# Patient Record
Sex: Male | Born: 1980 | Race: Black or African American | Hispanic: No | Marital: Married | Smoking: Current some day smoker
Health system: Southern US, Community
[De-identification: ages and names within clinical notes are randomized; demographics above are authoritative.]

## PROBLEM LIST (undated history)

## (undated) ENCOUNTER — Ambulatory Visit (HOSPITAL_COMMUNITY): Payer: Self-pay | Source: Home / Self Care

## (undated) DIAGNOSIS — M549 Dorsalgia, unspecified: Secondary | ICD-10-CM

## (undated) HISTORY — PX: APPENDECTOMY: SHX54

---

## 2002-01-05 ENCOUNTER — Emergency Department (HOSPITAL_COMMUNITY): Admission: EM | Admit: 2002-01-05 | Discharge: 2002-01-05 | Payer: Self-pay | Admitting: Emergency Medicine

## 2002-06-03 ENCOUNTER — Emergency Department (HOSPITAL_COMMUNITY): Admission: EM | Admit: 2002-06-03 | Discharge: 2002-06-03 | Payer: Self-pay | Admitting: *Deleted

## 2002-10-27 ENCOUNTER — Emergency Department (HOSPITAL_COMMUNITY): Admission: EM | Admit: 2002-10-27 | Discharge: 2002-10-27 | Payer: Self-pay | Admitting: *Deleted

## 2003-02-11 ENCOUNTER — Emergency Department (HOSPITAL_COMMUNITY): Admission: EM | Admit: 2003-02-11 | Discharge: 2003-02-11 | Payer: Self-pay | Admitting: Emergency Medicine

## 2003-02-11 ENCOUNTER — Encounter: Payer: Self-pay | Admitting: Emergency Medicine

## 2003-08-02 ENCOUNTER — Emergency Department (HOSPITAL_COMMUNITY): Admission: EM | Admit: 2003-08-02 | Discharge: 2003-08-02 | Payer: Self-pay | Admitting: Emergency Medicine

## 2004-02-09 ENCOUNTER — Emergency Department (HOSPITAL_COMMUNITY): Admission: EM | Admit: 2004-02-09 | Discharge: 2004-02-09 | Payer: Self-pay | Admitting: Emergency Medicine

## 2004-09-13 ENCOUNTER — Emergency Department (HOSPITAL_COMMUNITY): Admission: EM | Admit: 2004-09-13 | Discharge: 2004-09-13 | Payer: Self-pay | Admitting: Emergency Medicine

## 2004-11-13 ENCOUNTER — Emergency Department (HOSPITAL_COMMUNITY): Admission: EM | Admit: 2004-11-13 | Discharge: 2004-11-13 | Payer: Self-pay | Admitting: Emergency Medicine

## 2006-06-15 ENCOUNTER — Emergency Department (HOSPITAL_COMMUNITY): Admission: EM | Admit: 2006-06-15 | Discharge: 2006-06-15 | Payer: Self-pay | Admitting: Emergency Medicine

## 2008-02-07 ENCOUNTER — Emergency Department (HOSPITAL_COMMUNITY): Admission: EM | Admit: 2008-02-07 | Discharge: 2008-02-07 | Payer: Self-pay | Admitting: Emergency Medicine

## 2008-02-08 ENCOUNTER — Emergency Department (HOSPITAL_COMMUNITY): Admission: EM | Admit: 2008-02-08 | Discharge: 2008-02-09 | Payer: Self-pay | Admitting: Emergency Medicine

## 2011-05-14 ENCOUNTER — Emergency Department (HOSPITAL_COMMUNITY)
Admission: EM | Admit: 2011-05-14 | Discharge: 2011-05-14 | Disposition: A | Discharge status: Home / Self Care | Payer: Self-pay | Attending: Emergency Medicine | Admitting: Emergency Medicine

## 2011-05-14 ENCOUNTER — Emergency Department (HOSPITAL_COMMUNITY): Payer: Self-pay

## 2011-05-14 DIAGNOSIS — N200 Calculus of kidney: Secondary | ICD-10-CM | POA: Insufficient documentation

## 2011-05-14 DIAGNOSIS — M25569 Pain in unspecified knee: Secondary | ICD-10-CM | POA: Insufficient documentation

## 2011-05-14 DIAGNOSIS — R404 Transient alteration of awareness: Secondary | ICD-10-CM | POA: Insufficient documentation

## 2011-05-14 DIAGNOSIS — F1010 Alcohol abuse, uncomplicated: Secondary | ICD-10-CM | POA: Insufficient documentation

## 2011-05-14 DIAGNOSIS — R079 Chest pain, unspecified: Secondary | ICD-10-CM | POA: Insufficient documentation

## 2011-05-14 DIAGNOSIS — S0990XA Unspecified injury of head, initial encounter: Secondary | ICD-10-CM | POA: Insufficient documentation

## 2011-05-14 DIAGNOSIS — Y9289 Other specified places as the place of occurrence of the external cause: Secondary | ICD-10-CM | POA: Insufficient documentation

## 2011-05-14 DIAGNOSIS — F1410 Cocaine abuse, uncomplicated: Secondary | ICD-10-CM | POA: Insufficient documentation

## 2011-05-14 DIAGNOSIS — M79609 Pain in unspecified limb: Secondary | ICD-10-CM | POA: Insufficient documentation

## 2011-05-14 DIAGNOSIS — IMO0002 Reserved for concepts with insufficient information to code with codable children: Secondary | ICD-10-CM | POA: Insufficient documentation

## 2011-05-14 LAB — CBC
HCT: 43.3 % (ref 39.0–52.0)
MCH: 32.6 pg (ref 26.0–34.0)
MCV: 90.6 fL (ref 78.0–100.0)
Platelets: 239 10*3/uL (ref 150–400)
RBC: 4.78 MIL/uL (ref 4.22–5.81)
WBC: 4.8 10*3/uL (ref 4.0–10.5)

## 2011-05-14 LAB — POCT I-STAT, CHEM 8
BUN: 9 mg/dL (ref 6–23)
Calcium, Ion: 1.08 mmol/L — ABNORMAL LOW (ref 1.12–1.32)
Chloride: 104 mEq/L (ref 96–112)
Glucose, Bld: 98 mg/dL (ref 70–99)
Potassium: 3.6 mEq/L (ref 3.5–5.1)

## 2011-05-14 LAB — DIFFERENTIAL
Eosinophils Absolute: 0 10*3/uL (ref 0.0–0.7)
Lymphocytes Relative: 32 % (ref 12–46)
Lymphs Abs: 1.5 10*3/uL (ref 0.7–4.0)
Monocytes Relative: 6 % (ref 3–12)
Neutrophils Relative %: 61 % (ref 43–77)

## 2011-05-14 LAB — RAPID URINE DRUG SCREEN, HOSP PERFORMED
Amphetamines: NOT DETECTED
Benzodiazepines: NOT DETECTED
Cocaine: POSITIVE — AB
Opiates: NOT DETECTED
Tetrahydrocannabinol: NOT DETECTED

## 2011-05-14 LAB — SALICYLATE LEVEL: Salicylate Lvl: <2 mg/dL — ABNORMAL LOW (ref 2.8–20.0)

## 2011-05-14 MED ORDER — IOHEXOL 300 MG/ML  SOLN
100.0000 mL | Freq: Once | INTRAMUSCULAR | Status: AC | PRN
  Administered 2011-05-14: 100 mL via INTRAVENOUS

## 2011-07-31 ENCOUNTER — Emergency Department (HOSPITAL_COMMUNITY)
Admission: EM | Admit: 2011-07-31 | Discharge: 2011-07-31 | Disposition: A | Discharge status: Home / Self Care | Payer: Self-pay | Attending: Emergency Medicine | Admitting: Emergency Medicine

## 2011-07-31 DIAGNOSIS — IMO0002 Reserved for concepts with insufficient information to code with codable children: Secondary | ICD-10-CM | POA: Insufficient documentation

## 2012-01-15 ENCOUNTER — Ambulatory Visit: Payer: BC Managed Care – PPO

## 2012-01-15 ENCOUNTER — Ambulatory Visit (INDEPENDENT_AMBULATORY_CARE_PROVIDER_SITE_OTHER): Payer: BC Managed Care – PPO | Admitting: Emergency Medicine

## 2012-01-15 VITALS — BP 120/96 | HR 87 | Temp 98.3°F | Resp 16 | Ht 71.5 in | Wt 200.6 lb

## 2012-01-15 DIAGNOSIS — IMO0002 Reserved for concepts with insufficient information to code with codable children: Secondary | ICD-10-CM

## 2012-01-15 DIAGNOSIS — S46919A Strain of unspecified muscle, fascia and tendon at shoulder and upper arm level, unspecified arm, initial encounter: Secondary | ICD-10-CM

## 2012-01-15 DIAGNOSIS — M778 Other enthesopathies, not elsewhere classified: Secondary | ICD-10-CM

## 2012-01-15 DIAGNOSIS — M25539 Pain in unspecified wrist: Secondary | ICD-10-CM

## 2012-01-15 MED ORDER — HYDROCODONE-ACETAMINOPHEN 5-325 MG PO TABS: 1.0000 | ORAL_TABLET | Freq: Four times a day (QID) | ORAL | Status: AC | PRN

## 2012-01-15 MED ORDER — MELOXICAM 15 MG PO TABS: 15.0000 mg | ORAL_TABLET | Freq: Every day | ORAL | Status: AC

## 2012-01-15 NOTE — Progress Notes (Signed)
  Subjective:    Patient ID: Nathan Simon, male    DOB: Aug 02, 1981, 31 y.o.   MRN: [id number]  HPI patient here with pain in his right arm. Patient works with his right arm continuously at work. He has severe pain in his elbow forearm and right wrist. He feels weak in his right arm.    Review of Systems review of systems is consistent with the discomfort he is feeling in his right arm     Objective:   Physical Exam  Musculoskeletal:       There is tenderness over the right biceps muscle. Pulses are 2+ and symmetrical right radial and ulnar arteries. Deep tendon reflexes are 2+ and symmetrical. Motor is 5 out of 5 in left arm on the right arm the patient has decreased grip strength and weakness with extension of the arm against resistance    [facility] reading (PRIMARY) by  Dr.Brithany Whitworth x-ray of the forearm and of the elbow are normal.       Assessment & Plan:  Patient has an overuse syndrome of the right arm placed on Mobic 15 mg one every morning Norco 320 5 at night for pain.

## 2019-02-27 ENCOUNTER — Ambulatory Visit (HOSPITAL_COMMUNITY)
Admission: EM | Admit: 2019-02-27 | Discharge: 2019-02-27 | Disposition: A | Discharge status: Home / Self Care | Payer: 59 | Attending: Orthopedic Surgery | Admitting: Orthopedic Surgery

## 2019-02-27 ENCOUNTER — Encounter (HOSPITAL_COMMUNITY): Payer: Self-pay

## 2019-02-27 ENCOUNTER — Other Ambulatory Visit: Payer: Self-pay

## 2019-02-27 DIAGNOSIS — M545 Low back pain, unspecified: Secondary | ICD-10-CM

## 2019-02-27 DIAGNOSIS — S39012A Strain of muscle, fascia and tendon of lower back, initial encounter: Secondary | ICD-10-CM

## 2019-02-27 HISTORY — DX: Dorsalgia, unspecified: M54.9

## 2019-02-27 MED ORDER — METHOCARBAMOL 500 MG PO TABS: 500.0000 mg | ORAL_TABLET | Freq: Three times a day (TID) | ORAL | 0 refills | Status: DC

## 2019-02-27 NOTE — Discharge Instructions (Addendum)
Please continue with ibuprofen over-the-counter and take methocarbamol as needed for muscle relaxation.  Avoid heavy lifting pushing or pulling.  Follow-up with orthopedics.  Return to the urgent care facility for any worsening symptoms or urgent changes in your health.

## 2019-02-27 NOTE — ED Triage Notes (Signed)
Pt presents with chronic back pain. 

## 2019-02-27 NOTE — ED Provider Notes (Signed)
[facility]    CSN: [id number] Arrival date & time: 02/27/19  1635     History   Chief Complaint Chief Complaint  Patient presents with  . Back Pain    HPI Nathan Simon is a 38 y.o. male.   Presents to the urgent care facility for evaluation of acute on chronic back pain.  Patient states few days ago he was lifting at work and felt some pain in his lower back.  He has had intermittent episodes of left lower back pain and muscle tightness over the last 2 years.  Patient states his back pain was feeling better while taking muscle relaxer, methocarbamol but patient ran out of this medication today and is requesting a refill.  He is also requesting a note for work on Friday and for tomorrow, Monday.  HPI  Past Medical History:  Diagnosis Date  . Back ache     There are no active problems to display for this patient.   Past Surgical History:  Procedure Laterality Date  . APPENDECTOMY         Home Medications    Prior to Admission medications   Medication Sig Start Date End Date Taking? Authorizing Provider  methocarbamol (ROBAXIN) 500 MG tablet Take 1 tablet (500 mg total) by mouth 3 (three) times daily. 02/27/19   Evon Slack, PA-C    Family History History reviewed. No pertinent family history.  Social History Social History   Tobacco Use  . Smoking status: Current Some Day Smoker    Types: Cigars  Substance Use Topics  . Alcohol use: Not on file  . Drug use: Not on file     Allergies   Patient has no known allergies.   Review of Systems Review of Systems  Constitutional: Negative for fever.  Respiratory: Negative for shortness of breath.   Cardiovascular: Negative for chest pain.  Gastrointestinal: Negative for abdominal pain.  Genitourinary: Negative for difficulty urinating, dysuria and urgency.  Musculoskeletal: Positive for back pain. Negative for gait problem, joint swelling, myalgias and neck pain.  Skin: Negative for rash.   Neurological: Negative for dizziness, numbness and headaches.     Physical Exam Triage Vital Signs ED Triage Vitals  Enc Vitals Group     BP 02/27/19 1655 (!) 146/89     Pulse Rate 02/27/19 1655 75     Resp 02/27/19 1655 18     Temp 02/27/19 1655 98.5 F (36.9 C)     Temp Source 02/27/19 1655 Oral     SpO2 02/27/19 1655 96 %     Weight --      Height --      Head Circumference --      Peak Flow --      Pain Score 02/27/19 1656 7     Pain Loc --      Pain Edu? --      Excl. in GC? --    No data found.  Updated Vital Signs BP (!) 146/89 (BP Location: Right Arm)   Pulse 75   Temp 98.5 F (36.9 C) (Oral)   Resp 18   SpO2 96%   Visual Acuity Right Eye Distance:   Left Eye Distance:   Bilateral Distance:    Right Eye Near:   Left Eye Near:    Bilateral Near:     Physical Exam Constitutional:      Appearance: He is well-developed.  HENT:     Head: Normocephalic and atraumatic.  Eyes:  Conjunctiva/sclera: Conjunctivae normal.  Neck:     Musculoskeletal: Normal range of motion.  Cardiovascular:     Rate and Rhythm: Normal rate.  Pulmonary:     Effort: Pulmonary effort is normal. No respiratory distress.  Musculoskeletal:     Comments: Lumbar Spine: Examination of the lumbar spine reveals no bony abnormality, no edema, and no ecchymosis.  There is no step off.  The patient has full range of motion of the lumbar spine with flexion and extension.  The patient has normal lateral bend and rotation.  There is pain with lumbar flexion and extension.  The patient has a negative axial load test, and a negative rotational Waddell test.  The patient is non tender along the spinous process.  The patient is tender along the paravertebral muscles, along the left SI joint.  The patient is non tender along the iliac crest.  The patient is non tender in the sciatic notch.  The patient is non tender along the Sacroiliac joint.  There is no Coccyx joint tenderness.    Bilateral  Lower Extremities: Examination of the lower extremities reveals no bony abnormality, no edema, and no ecchymosis.  The patient has full active and passive range of motion of the hips, knees, and ankles.  There is no discomfort with range of motion exercises.  The patient is non tender along the greater trochanter region.  The patient has a negative Denna Haggard' test bilaterally.  There is normal skin warmth.  There is normal capillary refill bilaterally.    Neurologic: The patient has a negative straight leg raise.  The patient has normal muscle strength testing for the quadriceps, calves, ankle dorsiflexion, ankle plantarflexion, and extensor hallicus longus.  The patient has sensation that is intact to light touch.  Patellar reflexes are normal bilaterally.   Skin:    General: Skin is warm.     Findings: No rash.  Neurological:     Mental Status: He is alert and oriented to person, place, and time.  Psychiatric:        Behavior: Behavior normal.        Thought Content: Thought content normal.      UC Treatments / Results  Labs (all labs ordered are listed, but only abnormal results are displayed) Labs Reviewed - No data to display  EKG None  Radiology No results found.  Procedures Procedures (including critical care time)  Medications Ordered in UC Medications - No data to display  Initial Impression / Assessment and Plan / UC Course  I have reviewed the triage vital signs and the nursing notes.  Pertinent labs & imaging results that were available during my care of the patient were reviewed by me and considered in my medical decision making (see chart for details).     38 year old male with acute on chronic low back pain with muscle spasms.  No radicular symptoms or neurological deficits.  Vital signs are stable.  He is given a prescription for methocarbamol and a work note for days missed and also for tomorrow.  Patient will follow-up with primary care provider.  He will  continue with over-the-counter ibuprofen.  Avoid heavy lifting. Final Clinical Impressions(s) / UC Diagnoses   Final diagnoses:  Acute left-sided low back pain without sciatica  Strain of lumbar region, initial encounter     Discharge Instructions     Please continue with ibuprofen over-the-counter and take methocarbamol as needed for muscle relaxation.  Avoid heavy lifting pushing or pulling.  Follow-up with orthopedics.  Return to the urgent care facility for any worsening symptoms or urgent changes in your health.   ED Prescriptions    Medication Sig Dispense Auth. Provider   methocarbamol (ROBAXIN) 500 MG tablet Take 1 tablet (500 mg total) by mouth 3 (three) times daily. 30 tablet Ronnette JuniperGaines,  C, PA-C       Evon SlackGaines,  C, [place]PA-C 02/27/19 1724

## 2019-04-06 ENCOUNTER — Ambulatory Visit (HOSPITAL_COMMUNITY)
Admission: EM | Admit: 2019-04-06 | Discharge: 2019-04-06 | Disposition: A | Discharge status: Home / Self Care | Payer: 59

## 2019-04-06 ENCOUNTER — Other Ambulatory Visit: Payer: Self-pay

## 2019-04-06 ENCOUNTER — Encounter (HOSPITAL_COMMUNITY): Payer: Self-pay

## 2019-04-06 ENCOUNTER — Ambulatory Visit (HOSPITAL_COMMUNITY): Admission: EM | Admit: 2019-04-06 | Discharge: 2019-04-06 | Discharge status: Absent without leave | Payer: Self-pay

## 2019-04-06 DIAGNOSIS — R609 Edema, unspecified: Secondary | ICD-10-CM | POA: Diagnosis not present

## 2019-04-06 DIAGNOSIS — R600 Localized edema: Secondary | ICD-10-CM

## 2019-04-06 NOTE — Discharge Instructions (Addendum)
Nothing concerning on exam today. Decrease sodium intake and rest and elevate the legs. Compression stockings or socks can help.  If your symptoms continue or worsen and you start developing other concerning symptoms to include headache, dizziness, chest pain or shortness of breath please be seen in person again for reevaluation

## 2019-04-06 NOTE — ED Provider Notes (Signed)
[facility]    CSN: [id number] Arrival date & time: 04/06/19  1316     History   Chief Complaint Chief Complaint  Patient presents with  . Leg Swelling    HPI Nathan Simon is a 38 y.o. male.   Patient is a 38 year old male with no significant past medical history.  He is presenting today with bilateral lower extremity edema and tenderness.  Symptoms have improved.  This started approximately 5 days ago.  Started after doing a lot of walking in the heat.  He also does a lot of walking and standing at work.  Symptoms improved with elevating the legs.  He has had increased sodium and water intake over the last for 5 days.  There is no current pain or swelling.  Denies any associated chest pain, shortness of breath, headaches or dizziness.  Denies any history of high blood pressure.  He does have family history of high blood pressure.  Patient is normotensive here today.   ROS per HPI      Past Medical History:  Diagnosis Date  . Back ache     There are no active problems to display for this patient.   Past Surgical History:  Procedure Laterality Date  . APPENDECTOMY         Home Medications    Prior to Admission medications   Medication Sig Start Date End Date Taking? Authorizing Provider  methocarbamol (ROBAXIN) 500 MG tablet Take 1 tablet (500 mg total) by mouth 3 (three) times daily. 02/27/19   Evon SlackGaines, Thomas C, PA-C    Family History History reviewed. No pertinent family history.  Social History Social History   Tobacco Use  . Smoking status: Current Some Day Smoker    Types: Cigars  . Smokeless tobacco: Never Used  Substance Use Topics  . Alcohol use: Not on file  . Drug use: Not on file     Allergies   Patient has no known allergies.   Review of Systems Review of Systems   Physical Exam Triage Vital Signs ED Triage Vitals  Enc Vitals Group     BP 04/06/19 1348 110/78     Pulse Rate 04/06/19 1348 82     Resp 04/06/19 1348  18     Temp 04/06/19 1348 98.4 F (36.9 C)     Temp src --      SpO2 04/06/19 1348 99 %     Weight 04/06/19 1346 205 lb (93 kg)     Height --      Head Circumference --      Peak Flow --      Pain Score 04/06/19 1346 4     Pain Loc --      Pain Edu? --      Excl. in GC? --    No data found.  Updated Vital Signs BP 110/78 (BP Location: Right Arm)   Pulse 82   Temp 98.4 F (36.9 C)   Resp 18   Wt 205 lb (93 kg)   SpO2 99%   BMI 28.19 kg/m   Visual Acuity Right Eye Distance:   Left Eye Distance:   Bilateral Distance:    Right Eye Near:   Left Eye Near:    Bilateral Near:     Physical Exam Vitals signs and nursing note reviewed.  Constitutional:      General: He is not in acute distress.    Appearance: Normal appearance. He is not ill-appearing, toxic-appearing or diaphoretic.  HENT:     Head: Normocephalic and atraumatic.     Nose: Nose normal.  Eyes:     Conjunctiva/sclera: Conjunctivae normal.  Neck:     Musculoskeletal: Normal range of motion.  Cardiovascular:     Rate and Rhythm: Normal rate and regular rhythm.     Pulses: Normal pulses.     Heart sounds: Normal heart sounds.  Pulmonary:     Effort: Pulmonary effort is normal.     Breath sounds: Normal breath sounds.  Musculoskeletal: Normal range of motion.  Skin:    General: Skin is warm and dry.  Neurological:     General: No focal deficit present.     Mental Status: He is alert.  Psychiatric:        Mood and Affect: Mood normal.      UC Treatments / Results  Labs (all labs ordered are listed, but only abnormal results are displayed) Labs Reviewed - No data to display  EKG None  Radiology No results found.  Procedures Procedures (including critical care time)  Medications Ordered in UC Medications - No data to display  Initial Impression / Assessment and Plan / UC Course  I have reviewed the triage vital signs and the nursing notes.  Pertinent labs & imaging results that were  available during my care of the patient were reviewed by me and considered in my medical decision making (see chart for details).     Exam benign No noticeable lower extremity swelling.  No calf pain or tenderness Patient denying any concerning signs or symptoms Reporting increase in walking, heat and sodium and water intake. Most likely this is the cause of his lower extremity edema His problem has improved with elevating his legs He has normotensive here today We will have him monitor his symptoms, decrease sodium intake and elevate legs.  Recommended compression stockings to help. If symptoms continue or worsen he did starts developing any chest pain, shortness of breath or other concerning symptoms he will need to be re-seen, most likely in the ER. Patient understanding and agree. Final Clinical Impressions(s) / UC Diagnoses   Final diagnoses:  Peripheral edema     Discharge Instructions     Nothing concerning on exam today. Decrease sodium intake and rest and elevate the legs. Compression stockings or socks can help.  If your symptoms continue or worsen and you start developing other concerning symptoms to include headache, dizziness, chest pain or shortness of breath please be seen in person again for reevaluation    ED Prescriptions    None     Controlled Substance Prescriptions [place] Controlled Substance Registry consulted? Not Applicable   Orvan July, NP 04/06/19 1440

## 2019-04-06 NOTE — ED Triage Notes (Signed)
Pt cc states his legs were swollen last night . Pt states the swelling went down and now they sore.  Pt states he was out protesting and doing a lot of walking around.

## 2019-04-27 ENCOUNTER — Ambulatory Visit (INDEPENDENT_AMBULATORY_CARE_PROVIDER_SITE_OTHER): Payer: Self-pay | Admitting: Internal Medicine

## 2019-04-27 ENCOUNTER — Encounter: Payer: Self-pay | Admitting: Internal Medicine

## 2019-04-27 ENCOUNTER — Other Ambulatory Visit: Payer: Self-pay

## 2019-04-27 DIAGNOSIS — G8929 Other chronic pain: Secondary | ICD-10-CM | POA: Insufficient documentation

## 2019-04-27 DIAGNOSIS — M545 Low back pain, unspecified: Secondary | ICD-10-CM | POA: Insufficient documentation

## 2019-04-27 DIAGNOSIS — R600 Localized edema: Secondary | ICD-10-CM | POA: Insufficient documentation

## 2019-04-27 NOTE — Progress Notes (Signed)
    Virtual Visit via Video Note  I connected with Nathan Simon on 04/27/19 at  3:30 PM EDT by a video enabled telemedicine application and verified that I am speaking with the correct person using two identifiers.  Location patient: home Location provider: work office Persons participating in the virtual visit: patient, provider  I discussed the limitations of evaluation and management by telemedicine and the availability of in person appointments. The patient expressed understanding and agreed to proceed.   HPI: He has scheduled this visit to establish care. His PMH is only significant for long-standing back pain which he would like assessed. He has never been imaged or done PT. [place] the [place] recently had to go to UC because of swelling in both legs. He was told his BP was fine and it was probably due to a combination of the heat and increased sodium in diet. He feels his legs have improved some but would like to be seen for that as well.   ROS: Constitutional: Denies fever, chills, diaphoresis, appetite change and fatigue.  HEENT: Denies photophobia, eye pain, redness, hearing loss, ear pain, congestion, sore throat, rhinorrhea, sneezing, mouth sores, trouble swallowing, neck pain, neck stiffness and tinnitus.   Respiratory: Denies SOB, DOE, cough, chest tightness,  and wheezing.   Cardiovascular: Denies chest pain, palpitations..  Gastrointestinal: Denies nausea, vomiting, abdominal pain, diarrhea, constipation, blood in stool and abdominal distention.  Genitourinary: Denies dysuria, urgency, frequency, hematuria, flank pain and difficulty urinating.  Endocrine: Denies: hot or cold intolerance, sweats, changes in hair or nails, polyuria, polydipsia. Musculoskeletal: Denies myalgias, back pain, joint swelling, arthralgias and gait problem.  Skin: Denies pallor, rash and wound.  Neurological: Denies dizziness, seizures, syncope, weakness, light-headedness, numbness  and headaches.  Hematological: Denies adenopathy. Easy bruising, personal or family bleeding history  Psychiatric/Behavioral: Denies suicidal ideation, mood changes, confusion, nervousness, sleep disturbance and agitation   History reviewed. No pertinent past medical history.  History reviewed. No pertinent surgical history.  History reviewed. No pertinent family history.  SOCIAL HX:   has no history on file for tobacco, alcohol, and drug.  No current outpatient medications on file.  EXAM:   VITALS per patient if applicable: none reported  GENERAL: alert, oriented, appears well and in no acute distress  HEENT: atraumatic, conjunttiva clear, no obvious abnormalities on inspection of external nose and ears  NECK: normal movements of the head and neck  LUNGS: on inspection no signs of respiratory distress, breathing rate appears normal, no obvious gross increased work of breathing, gasping or wheezing  CV: no obvious cyanosis  MS: moves all visible extremities without noticeable abnormality  PSYCH/NEURO: pleasant and cooperative, no obvious depression or anxiety, speech and thought processing grossly intact  ASSESSMENT AND PLAN:   Bilateral leg edema  Chronic midline low back pain without sciatica  -He will come in the office for formal evaluation and CPE     I discussed the assessment and treatment plan with the patient. The patient was provided an opportunity to ask questions and all were answered. The patient agreed with the plan and demonstrated an understanding of the instructions.   The patient was advised to call back or seek an in-person evaluation if the symptoms worsen or if the condition fails to improve as anticipated.    Lelon Frohlich, MD   Primary Care at [facility]

## 2019-06-03 ENCOUNTER — Encounter (HOSPITAL_COMMUNITY): Payer: Self-pay

## 2019-06-07 ENCOUNTER — Ambulatory Visit: Payer: Self-pay | Admitting: Internal Medicine

## 2019-06-07 DIAGNOSIS — Z0289 Encounter for other administrative examinations: Secondary | ICD-10-CM

## 2019-08-04 ENCOUNTER — Other Ambulatory Visit: Payer: Self-pay

## 2019-08-04 ENCOUNTER — Ambulatory Visit (HOSPITAL_COMMUNITY)
Admission: EM | Admit: 2019-08-04 | Discharge: 2019-08-04 | Disposition: A | Discharge status: Home / Self Care | Payer: 59 | Attending: Family Medicine | Admitting: Family Medicine

## 2019-08-04 ENCOUNTER — Encounter (HOSPITAL_COMMUNITY): Payer: Self-pay

## 2019-08-04 DIAGNOSIS — K4090 Unilateral inguinal hernia, without obstruction or gangrene, not specified as recurrent: Secondary | ICD-10-CM | POA: Diagnosis not present

## 2019-08-04 MED ORDER — IBUPROFEN 800 MG PO TABS: 800.0000 mg | ORAL_TABLET | Freq: Three times a day (TID) | ORAL | 0 refills | Status: DC

## 2019-08-04 NOTE — ED Provider Notes (Signed)
[facility] [place]    CSN: [id number] Arrival date & time: 08/04/19  1003      History   Chief Complaint Chief Complaint  Patient presents with  . Abdominal Pain    HPI Nathan Simon is a 38 y.o. male.   HPI  Patient is here for lower abdominal pain.  He has a bulge and lower abdominal pain on the left side.  Is down in his groin.  It hurts when he strains.  It hurts when he sits up after lying flat in bed.  He states is been present for 2 weeks.  It is getting worse.  He does a lot of heavy lifting on the job.  He does not remember an incident on the job.  He has not reported to his employer. I explained to him that even though he does repetitive and heavy lifting and thus he has an incident that he can recall, it is unlikely that he will be able to claim this is Worker's Comp. Normal appetite.  Normal bowels.  No fever. Past Medical History:  Diagnosis Date  . Back ache     Patient Active Problem List   Diagnosis Date Noted  . Bilateral leg edema 04/27/2019  . Chronic low back pain without sciatica 04/27/2019    Past Surgical History:  Procedure Laterality Date  . APPENDECTOMY         Home Medications    Prior to Admission medications   Medication Sig Start Date End Date Taking? Authorizing Provider  ibuprofen (ADVIL) 800 MG tablet Take 1 tablet (800 mg total) by mouth 3 (three) times daily. 08/04/19   Raylene Everts, MD  methocarbamol (ROBAXIN) 500 MG tablet Take 1 tablet (500 mg total) by mouth 3 (three) times daily. 02/27/19   Duanne Guess, PA-C    Family History History reviewed. No pertinent family history.  Social History Social History   Tobacco Use  . Smoking status: Current Some Day Smoker    Types: Cigars  . Smokeless tobacco: Never Used  Substance Use Topics  . Alcohol use: Not on file  . Drug use: Not on file     Allergies   Patient has no known allergies.   Review of Systems Review of Systems  Constitutional: Negative  for chills and fever.  HENT: Negative for ear pain and sore throat.   Eyes: Negative for pain and visual disturbance.  Respiratory: Negative for cough and shortness of breath.   Cardiovascular: Negative for chest pain and palpitations.  Gastrointestinal: Positive for abdominal pain. Negative for nausea and vomiting.  Genitourinary: Negative for dysuria, hematuria, scrotal swelling and testicular pain.  Musculoskeletal: Negative for arthralgias and back pain.  Skin: Negative for color change and rash.  Neurological: Negative for seizures and syncope.  All other systems reviewed and are negative.    Physical Exam Triage Vital Signs ED Triage Vitals  Enc Vitals Group     BP 08/04/19 1023 115/79     Pulse Rate 08/04/19 1023 87     Resp 08/04/19 1023 16     Temp 08/04/19 1023 98.9 F (37.2 C)     Temp Source 08/04/19 1023 Oral     SpO2 08/04/19 1023 97 %     Weight --      Height --      Head Circumference --      Peak Flow --      Pain Score 08/04/19 1022 8     Pain Loc --  Pain Edu? --      Excl. in GC? --    No data found.  Updated Vital Signs BP 115/79 (BP Location: Left Arm)   Pulse 87   Temp 98.9 F (37.2 C) (Oral)   Resp 16   SpO2 97%       Physical Exam Constitutional:      General: He is not in acute distress.    Appearance: He is well-developed.  HENT:     Head: Normocephalic and atraumatic.  Eyes:     Conjunctiva/sclera: Conjunctivae normal.     Pupils: Pupils are equal, round, and reactive to light.  Neck:     Musculoskeletal: Normal range of motion.  Cardiovascular:     Rate and Rhythm: Normal rate.  Pulmonary:     Effort: Pulmonary effort is normal. No respiratory distress.  Abdominal:     General: Abdomen is flat. Bowel sounds are normal. There is no distension.     Palpations: Abdomen is soft. There is no hepatomegaly or splenomegaly.     Tenderness: There is abdominal tenderness.     Hernia: A hernia is present. Hernia is present in the  left inguinal area.    Musculoskeletal: Normal range of motion.  Skin:    General: Skin is warm and dry.  Neurological:     Mental Status: He is alert.      UC Treatments / Results  Labs (all labs ordered are listed, but only abnormal results are displayed) Labs Reviewed - No data to display  EKG   Radiology No results found.  Procedures Procedures (including critical care time)  Medications Ordered in UC Medications - No data to display  Initial Impression / Assessment and Plan / UC Course  I have reviewed the triage vital signs and the nursing notes.  Pertinent labs & imaging results that were available during my care of the patient were reviewed by me and considered in my medical decision making (see chart for details).     I called [facility].  Explained the predicament.  Patient wants to be seen ASAP so he does not miss work.  He is certain that he will not be allowed to work with restrictions.  I secured an appointment with him for Monday.  Gave him until Monday off.  Final Clinical Impressions(s) / UC Diagnoses   Final diagnoses:  Non-recurrent unilateral inguinal hernia without obstruction or gangrene     Discharge Instructions     See Dr. Ricky Stabs Monday at 1:40 GO to er if suddenly worse Take ibuprofen for pain No heavy lifting   ED Prescriptions    Medication Sig Dispense Auth. Provider   ibuprofen (ADVIL) 800 MG tablet Take 1 tablet (800 mg total) by mouth 3 (three) times daily. 21 tablet Eustace Moore, MD     PDMP not reviewed this encounter.   Eustace Moore, MD 08/04/19 [phone]

## 2019-08-04 NOTE — Discharge Instructions (Addendum)
See Dr. Lynford Humphrey Monday at 1:40 GO to er if suddenly worse Take ibuprofen for pain No heavy lifting

## 2019-08-04 NOTE — ED Triage Notes (Signed)
Pt report having abdominal pain x 2 weeks.

## 2020-02-16 ENCOUNTER — Ambulatory Visit (INDEPENDENT_AMBULATORY_CARE_PROVIDER_SITE_OTHER): Payer: 59 | Admitting: Licensed Clinical Social Worker

## 2020-02-16 DIAGNOSIS — F331 Major depressive disorder, recurrent, moderate: Secondary | ICD-10-CM
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
## 2020-03-05 ENCOUNTER — Other Ambulatory Visit: Payer: Self-pay

## 2020-03-05 ENCOUNTER — Encounter (HOSPITAL_COMMUNITY): Payer: Self-pay

## 2020-03-05 ENCOUNTER — Ambulatory Visit (HOSPITAL_COMMUNITY)
Admission: EM | Admit: 2020-03-05 | Discharge: 2020-03-05 | Disposition: A | Discharge status: Home / Self Care | Payer: 59 | Attending: Emergency Medicine | Admitting: Emergency Medicine

## 2020-03-05 DIAGNOSIS — S46811A Strain of other muscles, fascia and tendons at shoulder and upper arm level, right arm, initial encounter: Secondary | ICD-10-CM | POA: Diagnosis not present

## 2020-03-05 MED ORDER — TIZANIDINE HCL 4 MG PO TABS
4.0000 mg | ORAL_TABLET | Freq: Four times a day (QID) | ORAL | 0 refills | Status: DC | PRN
Start: 2020-03-05 — End: 2020-04-18

## 2020-03-05 MED ORDER — IBUPROFEN 800 MG PO TABS
800.0000 mg | ORAL_TABLET | Freq: Three times a day (TID) | ORAL | 0 refills | Status: DC
Start: 2020-03-05 — End: 2020-04-18

## 2020-03-05 NOTE — ED Triage Notes (Signed)
Pt c/o right neck pain; points top of right trapezius. Pt states he awoke with pain/stiffness and reports pain increases with movement of right arm. Denies CP, SOB, diaphoresis, n/v or radiating pain from origin.

## 2020-03-05 NOTE — Discharge Instructions (Signed)
Use anti-inflammatories for pain/swelling. You may take up to 800 mg Ibuprofen every 8 hours with food. You may supplement Ibuprofen with Tylenol [phone] mg every 8 hours.    You may use tizanidine as needed to help with pain. This is a muscle relaxer and causes sedation- please use only at bedtime or when you will be home and not have to drive/work  Gentle stretching, alternate ice and heat  Follow up if not improving

## 2020-03-05 NOTE — ED Provider Notes (Signed)
[facility]    CSN: [id number] Arrival date & time: 03/05/20  1320      History   Chief Complaint Chief Complaint  Patient presents with  . Neck Pain    HPI Nathan Simon is a 39 y.o. male no significant past medical history presenting today for evaluation of neck pain.  Patient notes that over the past 2 days he has had pain to his right neck extending towards the shoulder.  Woke up with this.  Denies any injury trauma or fall.  Does report working overtime and does heavy lifting at work.  Denies any changes in vision, lightheadedness or dizziness.  Denies any fevers.  Denies chest pain or shortness of breath.  Has been using Tylenol.  HPI  Past Medical History:  Diagnosis Date  . Back ache     Patient Active Problem List   Diagnosis Date Noted  . Bilateral leg edema 04/27/2019  . Chronic low back pain without sciatica 04/27/2019    Past Surgical History:  Procedure Laterality Date  . APPENDECTOMY         Home Medications    Prior to Admission medications   Medication Sig Start Date End Date Taking? Authorizing Provider  ibuprofen (ADVIL) 800 MG tablet Take 1 tablet (800 mg total) by mouth 3 (three) times daily. 03/05/20   Gardner Servantes C, PA-C  tiZANidine (ZANAFLEX) 4 MG tablet Take 1 tablet (4 mg total) by mouth every 6 (six) hours as needed for muscle spasms. 03/05/20   Artem Bunte, Junius Creamer, PA-C    Family History Family History  Problem Relation Age of Onset  . Diabetes Mother   . Hypertension Mother   . Healthy Father     Social History Social History   Tobacco Use  . Smoking status: Current Some Day Smoker    Types: Cigars  . Smokeless tobacco: Never Used  Substance Use Topics  . Alcohol use: Not on file  . Drug use: Not on file     Allergies   Patient has no known allergies.   Review of Systems Review of Systems  Constitutional: Negative for activity change, appetite change, chills, fatigue and fever.  HENT: Negative for  congestion, ear pain, rhinorrhea, sinus pressure, sore throat and trouble swallowing.   Eyes: Negative for discharge and redness.  Respiratory: Negative for cough, chest tightness and shortness of breath.   Cardiovascular: Negative for chest pain.  Gastrointestinal: Negative for abdominal pain, diarrhea, nausea and vomiting.  Musculoskeletal: Positive for myalgias and neck pain. Negative for neck stiffness.  Skin: Negative for rash.  Neurological: Negative for dizziness, light-headedness and headaches.     Physical Exam Triage Vital Signs ED Triage Vitals  Enc Vitals Group     BP 03/05/20 1335 115/82     Pulse Rate 03/05/20 1335 97     Resp 03/05/20 1335 18     Temp 03/05/20 1335 98.5 F (36.9 C)     Temp Source 03/05/20 1335 Oral     SpO2 03/05/20 1335 98 %     Weight --      Height --      Head Circumference --      Peak Flow --      Pain Score 03/05/20 1333 7     Pain Loc --      Pain Edu? --      Excl. in GC? --    No data found.  Updated Vital Signs BP 115/82 (BP Location: Right Arm)  Pulse 97   Temp 98.5 F (36.9 C) (Oral)   Resp 18   SpO2 98%   Visual Acuity Right Eye Distance:   Left Eye Distance:   Bilateral Distance:    Right Eye Near:   Left Eye Near:    Bilateral Near:     Physical Exam Vitals and nursing note reviewed.  Constitutional:      Appearance: He is well-developed.     Comments: No acute distress  HENT:     Head: Normocephalic and atraumatic.     Nose: Nose normal.  Eyes:     Conjunctiva/sclera: Conjunctivae normal.  Cardiovascular:     Rate and Rhythm: Normal rate.  Pulmonary:     Effort: Pulmonary effort is normal. No respiratory distress.     Comments: Breathing comfortably at rest, CTABL, no wheezing, rales or other adventitious sounds auscultated Abdominal:     General: There is no distension.  Musculoskeletal:        General: Normal range of motion.     Cervical back: Neck supple.     Comments: Back: Nontender to  palpation of cervical, thoracic and lumbar spine midline, increased tenderness throughout right cervical and superior trapezius musculature  Right shoulder: Nontender to palpation along clavicle, AC joint or scapular spine, full active range of motion of shoulder, strength 5/5 and equal bilaterally, grip strength 5/5 ankle bilaterally, radial pulse 2+ bilaterally  Skin:    General: Skin is warm and dry.  Neurological:     Mental Status: He is alert and oriented to person, place, and time.      UC Treatments / Results  Labs (all labs ordered are listed, but only abnormal results are displayed) Labs Reviewed - No data to display  EKG   Radiology No results found.  Procedures Procedures (including critical care time)  Medications Ordered in UC Medications - No data to display  Initial Impression / Assessment and Plan / UC Course  I have reviewed the triage vital signs and the nursing notes.  Pertinent labs & imaging results that were available during my care of the patient were reviewed by me and considered in my medical decision making (see chart for details).     Suspect likely cervical versus trapezius strain.  Recommending anti-inflammatories and muscle relaxers with activity modification.Discussed strict return precautions. Patient verbalized understanding and is agreeable with plan.  Final Clinical Impressions(s) / UC Diagnoses   Final diagnoses:  Trapezius muscle strain, right, initial encounter     Discharge Instructions     Use anti-inflammatories for pain/swelling. You may take up to 800 mg Ibuprofen every 8 hours with food. You may supplement Ibuprofen with Tylenol [phone] mg every 8 hours.    You may use tizanidine as needed to help with pain. This is a muscle relaxer and causes sedation- please use only at bedtime or when you will be home and not have to drive/work  Gentle stretching, alternate ice and heat  Follow up if not improving   ED  Prescriptions    Medication Sig Dispense Auth. Provider   tiZANidine (ZANAFLEX) 4 MG tablet Take 1 tablet (4 mg total) by mouth every 6 (six) hours as needed for muscle spasms. 30 tablet Alberto Schoch C, PA-C   ibuprofen (ADVIL) 800 MG tablet Take 1 tablet (800 mg total) by mouth 3 (three) times daily. 21 tablet Mattheu Brodersen, Yoder C, PA-C     PDMP not reviewed this encounter.   Janith Lima, [place] 03/05/20 1516

## 2020-03-19 ENCOUNTER — Ambulatory Visit (INDEPENDENT_AMBULATORY_CARE_PROVIDER_SITE_OTHER): Payer: 59 | Admitting: Licensed Clinical Social Worker

## 2020-03-19 DIAGNOSIS — F331 Major depressive disorder, recurrent, moderate: Secondary | ICD-10-CM | POA: Diagnosis not present

## 2020-03-21 NOTE — Progress Notes (Signed)
Virtual Visit via Video Note  I connected with Nathan Simon on 03/21/20 at  4:00 PM EDT by a video enabled telemedicine application and verified that I am speaking with the correct person using two identifiers.  Location: Patient: Home Provider: Office   I discussed the limitations of evaluation and management by telemedicine and the availability of in person appointments. The patient expressed understanding and agreed to proceed.  THERAPIST PROGRESS NOTE  Session Time: 4:00 pm-4:45 pm  Participation Level: Active  Behavioral Response: CasualAlertDepressed  Type of Therapy: Individual Therapy  Treatment Goals addressed: Coping  Interventions: CBT and Solution Focused  Case Summary: Nathan Simon is a 39 y.o. male who presents oriented x5 (person, place, situation, time, and object), casually dressed, appropriately groomed, average height, average weight, and cooperative to address mood and anxiety. Patient has minimal history of medical treatment including chronic back pain. Patient has minimal history of mental health treatment. Patient denies suicidal and homicidal ideations. Patient denies psychosis including auditory and visual hallucinations. Patient is at low risk for lethality.  Session #1  Physically: Patient has been using alcohol heavily lately. Patient has also went on a cocaine binge once a week. Patient's sleep has been off. After discussion, patient understood that he needs to have a set time to fall asleep and wake up.Patient was emailed a list of sleep tips to regulate sleep. Patient also agreed to start waling twice a week. He understood that physical activity can help relieve stress and tire him out. Patient is going to invite his wife with him. Patient agreed to stop using cocaine.  Spiritually/values: No issues identified.  Relationships: Patient said that his wife is planning on leaving him. Patient admitted that when he was out using cocaine he was  flirtatious with women. Patient is trying to help his marriage.  Emotionally/Mentally/Behavior: Patient's mood is down and anxious. He worries about money. He feels like he wants to do things like get a home or do things for his children. He feels like he can't do these things due to financial strain. Patient recognized that when he is stressed, he can't sleep, when he can't sleep he is more likely to use, and when he uses then he gets stressed.   Patient engaged in session. Patient responded well to interventions. Patient continues to meet criteria for Major depressive disorder, recurrent, moderate with anxious distress. Patient will continue in outpatient therapy due to being the least restrictive service to meet his needs. Patient made minimal progress on his goals.   Suicidal/Homicidal: Negativewithout intent/plan  Therapist Response: Therapist reviewed patient's recent thoughts and behaviors. Therapist utilized CBT to address mood and anxious. Therapist processed patient's feelings to identify triggers for mood and anxiety. Therapist assisted patient in identifying patient's cycle of thoughts and behavior. Therapist assisted patient in identify steps to take to regulate sleep.   Plan: Return again in 1 weeks.  Diagnosis: Axis I: Major depressive disorder, recurrent episode, moderate with anxious distress    Axis II: No diagnosis   I discussed the assessment and treatment plan with the patient. The patient was provided an opportunity to ask questions and all were answered. The patient agreed with the plan and demonstrated an understanding of the instructions.   The patient was advised to call back or seek an in-person evaluation if the symptoms worsen or if the condition fails to improve as anticipated.  I provided 45 minutes of non-face-to-face time during this encounter.   Bynum Bellows, LCSW 03/21/2020

## 2020-04-03 ENCOUNTER — Ambulatory Visit (HOSPITAL_COMMUNITY): Payer: 59 | Admitting: Licensed Clinical Social Worker

## 2020-04-04 ENCOUNTER — Telehealth (HOSPITAL_COMMUNITY): Payer: 59 | Admitting: Psychiatry

## 2020-04-10 ENCOUNTER — Ambulatory Visit (INDEPENDENT_AMBULATORY_CARE_PROVIDER_SITE_OTHER): Payer: 59 | Admitting: Licensed Clinical Social Worker

## 2020-04-10 DIAGNOSIS — F331 Major depressive disorder, recurrent, moderate: Secondary | ICD-10-CM

## 2020-04-11 NOTE — Progress Notes (Signed)
Virtual Visit via Video Note  I connected with Greer Koeppen Binney on 04/11/20 at  4:00 PM EDT by a video enabled telemedicine application and verified that I am speaking with the correct person using two identifiers.  Location: Patient: Home Provider: Office   I discussed the limitations of evaluation and management by telemedicine and the availability of in person appointments. The patient expressed understanding and agreed to proceed.  THERAPIST PROGRESS NOTE  Session Time: 4:00 pm-4:45 pm  Participation Level: Active  Behavioral Response: CasualAlertDepressed  Type of Therapy: Individual Therapy  Treatment Goals addressed: Coping  Interventions: CBT and Solution Focused  Case Summary: Nathan Simon is a 39 y.o. male who presents oriented x5 (person, place, situation, time, and object), casually dressed, appropriately groomed, average height, average weight, and cooperative to address mood and anxiety. Patient has minimal history of medical treatment including chronic back pain. Patient has minimal history of mental health treatment. Patient denies suicidal and homicidal ideations. Patient denies psychosis including auditory and visual hallucinations. Patient is at low risk for lethality.  Session #2  Physically: Patient has not been using cocaine. Patient did admit to drinking heavily for his birthday. His drinking got out of control to the point he missed work and his last therapy appointment. He got suspended from work. Patient noted that if he starts drinking alcohol it is hard for him to stop. Patient and his wife have stopped drinking liquor. Patient continues to not sleep well.  Spiritually/values: No issues identified.  Relationships: Patient's wife is going to stay with him but with stipulations. She told him if he drinks liquor again that she will not stay with him. Patient is motivated to change to maintain his relationship. Patient is also trying to go on a trip this  weekend to [place] for his daughter's birthday. He has been suspended from work and his first day back is Friday but his family is leaving on Thursday. Patient feels stuck because if he misses work he will lose his job but if he misses his daughter's birthday celebration he will be sad. The trip was planned as well as another one in July but due to his suspension they took his vacation days.  Emotionally/Mentally/Behavior: Patient's mood is down. He has been stressed about work and finances. He is making it a focus to get to work and work overtime. Patient is making an effort to accept things he cannot change such as how his work took his vacation days but also did not give him full pay. Patient is making an effort to deal with stress without self medicating.   Patient engaged in session. Patient responded well to interventions. Patient continues to meet criteria for Major depressive disorder, recurrent, moderate with anxious distress. Patient will continue in outpatient therapy due to being the least restrictive service to meet his needs. Patient made minimal progress on his goals.   Suicidal/Homicidal: Negativewithout intent/plan  Therapist Response: Therapist reviewed patient's recent thoughts and behaviors. Therapist utilized CBT to address mood and anxious. Therapist processed patient's feelings to identify triggers for mood and anxiety. Therapist discussed with patient is alcohol use, and the consequences from his use.   Plan: Return again in 1 weeks.  Diagnosis: Axis I: Major depressive disorder, recurrent episode, moderate with anxious distress    Axis II: No diagnosis   I discussed the assessment and treatment plan with the patient. The patient was provided an opportunity to ask questions and all were answered. The patient agreed with  the plan and demonstrated an understanding of the instructions.   The patient was advised to call back or seek an in-person evaluation if the symptoms  worsen or if the condition fails to improve as anticipated.  I provided 45 minutes of non-face-to-face time during this encounter.   Glori Bickers, LCSW 04/11/2020

## 2020-04-12 ENCOUNTER — Other Ambulatory Visit: Payer: Self-pay

## 2020-04-12 ENCOUNTER — Ambulatory Visit (HOSPITAL_COMMUNITY)
Admission: EM | Admit: 2020-04-12 | Discharge: 2020-04-12 | Disposition: A | Discharge status: Home / Self Care | Payer: 59 | Attending: Emergency Medicine | Admitting: Emergency Medicine

## 2020-04-12 ENCOUNTER — Encounter (HOSPITAL_COMMUNITY): Payer: Self-pay | Admitting: Emergency Medicine

## 2020-04-12 DIAGNOSIS — M545 Low back pain: Secondary | ICD-10-CM

## 2020-04-12 DIAGNOSIS — G8929 Other chronic pain: Secondary | ICD-10-CM | POA: Diagnosis not present

## 2020-04-12 MED ORDER — KETOROLAC TROMETHAMINE 30 MG/ML IJ SOLN
30.0000 mg | Freq: Once | INTRAMUSCULAR | Status: AC
  Administered 2020-04-12: 30 mg via INTRAMUSCULAR

## 2020-04-12 MED ORDER — KETOROLAC TROMETHAMINE 30 MG/ML IJ SOLN
INTRAMUSCULAR | Status: AC
  Filled 2020-04-12: qty 1

## 2020-04-12 MED ORDER — PREDNISONE 10 MG (21) PO TBPK
ORAL_TABLET | Freq: Every day | ORAL | 0 refills | Status: DC
Start: 2020-04-12 — End: 2020-04-18
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
## 2020-04-12 NOTE — ED Triage Notes (Signed)
Pt c/o continious back pain since the last visit. Stating that this is the worst it has been and it is "20/10" pt states that he has not been tolerating the muscle relaxers he was given and "needs a shot of something" Pt is moaning and complaining of spasms during triage. He states his chest feels sore due to the position he had to sleep in to be comfortable.

## 2020-04-18 ENCOUNTER — Telehealth (HOSPITAL_COMMUNITY): Payer: 59 | Admitting: Licensed Clinical Social Worker

## 2020-04-18 ENCOUNTER — Ambulatory Visit (HOSPITAL_COMMUNITY)
Admission: EM | Admit: 2020-04-18 | Discharge: 2020-04-18 | Disposition: A | Discharge status: Home / Self Care | Payer: 59 | Attending: Family Medicine | Admitting: Family Medicine

## 2020-04-18 ENCOUNTER — Other Ambulatory Visit: Payer: Self-pay

## 2020-04-18 ENCOUNTER — Ambulatory Visit (INDEPENDENT_AMBULATORY_CARE_PROVIDER_SITE_OTHER): Payer: 59

## 2020-04-18 ENCOUNTER — Encounter (HOSPITAL_COMMUNITY): Payer: Self-pay | Admitting: Emergency Medicine

## 2020-04-18 DIAGNOSIS — M546 Pain in thoracic spine: Secondary | ICD-10-CM | POA: Diagnosis not present

## 2020-04-18 MED ORDER — MELOXICAM 7.5 MG PO TABS
7.5000 mg | ORAL_TABLET | Freq: Every day | ORAL | 0 refills | Status: DC
Start: 2020-04-18 — End: 2020-10-18

## 2020-04-18 MED ORDER — CYCLOBENZAPRINE HCL 10 MG PO TABS
10.0000 mg | ORAL_TABLET | Freq: Two times a day (BID) | ORAL | 0 refills | Status: DC | PRN
Start: 2020-04-18 — End: 2020-10-18

## 2020-04-18 NOTE — Discharge Instructions (Addendum)
Your x-ray not show anything concerning. We will try a different anti-inflammatory and muscle ask her to see if this helps. Recommend following up with sports medicine or ortho for further management

## 2020-04-18 NOTE — ED Triage Notes (Signed)
Pt was recently seen for back pain and states that his pain is still there. He finished the prednisone and states pain came back. He called to make an appointment with ortho but they have not called back yet. Pt states his job will not let him work with light duty and he needs a note.

## 2020-04-18 NOTE — ED Provider Notes (Signed)
[place]    CSN: [id number] Arrival date & time: 04/18/20  7829      History   Chief Complaint Chief Complaint  Patient presents with  . Back Pain    HPI MARCUS SCHWANDT is a 39 y.o. male.   Pt is a 39 year old male that presents with upper back pain. This has been constant for the past few weeks. He was seen here on 6/17 for the same.  He was given Toradol here in clinic that day which he reports helped.  He has been on prednisone taper and just finished last dose this morning.  Reporting still having upper back pain.  He has been using the muscle relaxer as needed.  He does a lot of heavy lifting at work.  Denies any specific injuries.  Denies any numbness, tingling, radiation of pain or weakness in extremities.  ROS per HPI      Past Medical History:  Diagnosis Date  . Back ache     Patient Active Problem List   Diagnosis Date Noted  . Bilateral leg edema 04/27/2019  . Chronic low back pain without sciatica 04/27/2019    Past Surgical History:  Procedure Laterality Date  . APPENDECTOMY         Home Medications    Prior to Admission medications   Medication Sig Start Date End Date Taking? Authorizing Provider  cyclobenzaprine (FLEXERIL) 10 MG tablet Take 1 tablet (10 mg total) by mouth 2 (two) times daily as needed for muscle spasms. 04/18/20   Loura Halt A, NP  meloxicam (MOBIC) 7.5 MG tablet Take 1 tablet (7.5 mg total) by mouth daily. 04/18/20   Orvan July, NP    Family History Family History  Problem Relation Age of Onset  . Diabetes Mother   . Hypertension Mother   . Healthy Father     Social History Social History   Tobacco Use  . Smoking status: Current Some Day Smoker    Types: Cigars  . Smokeless tobacco: Never Used  Substance Use Topics  . Alcohol use: Not Currently  . Drug use: Not on file     Allergies   Patient has no known allergies.   Review of Systems Review of Systems   Physical Exam Triage Vital  Signs ED Triage Vitals  Enc Vitals Group     BP 04/18/20 0856 (!) 141/98     Pulse Rate 04/18/20 0856 90     Resp 04/18/20 0856 16     Temp 04/18/20 0856 98.9 F (37.2 C)     Temp Source 04/18/20 0856 Oral     SpO2 04/18/20 0856 98 %     Weight --      Height --      Head Circumference --      Peak Flow --      Pain Score 04/18/20 0854 8     Pain Loc --      Pain Edu? --      Excl. in [place]? --    No data found.  Updated Vital Signs BP (!) 141/98 (BP Location: Right Arm)   Pulse 90   Temp 98.9 F (37.2 C) (Oral)   Resp 16   SpO2 98%   Visual Acuity Right Eye Distance:   Left Eye Distance:   Bilateral Distance:    Right Eye Near:   Left Eye Near:    Bilateral Near:     Physical Exam Vitals and nursing note reviewed.  Constitutional:      Appearance: Normal appearance.  HENT:     Head: Normocephalic and atraumatic.     Nose: Nose normal.  Eyes:     Conjunctiva/sclera: Conjunctivae normal.  Pulmonary:     Effort: Pulmonary effort is normal.  Musculoskeletal:        General: Normal range of motion.     Cervical back: Normal range of motion.  Skin:    General: Skin is warm and dry.  Neurological:     Mental Status: He is alert.  Psychiatric:        Mood and Affect: Mood normal.      UC Treatments / Results  Labs (all labs ordered are listed, but only abnormal results are displayed) Labs Reviewed - No data to display  EKG   Radiology DG Thoracic Spine 2 View  Result Date: 04/18/2020 CLINICAL DATA:  Persistent upper back pain with recent increase. No radiation of symptoms. No reported injury. EXAM: THORACIC SPINE 2 VIEWS COMPARISON:  None. FINDINGS: Very mild dextroconvex curvature of the lower thoracic spine. Alignment is otherwise anatomic. Vertebral body and disc space height are maintained. No degenerative changes. Cervicothoracic junction is in alignment. IMPRESSION: Mild dextroconvex curvature of the lower thoracic spine. No degenerative findings.  Electronically Signed   By: Leanna Battles M.D.   On: 04/18/2020 09:50    Procedures Procedures (including critical care time)  Medications Ordered in UC Medications - No data to display  Initial Impression / Assessment and Plan / UC Course  I have reviewed the triage vital signs and the nursing notes.  Pertinent labs & imaging results that were available during my care of the patient were reviewed by me and considered in my medical decision making (see chart for details).     Chronic upper back pain Was recently seen here for same and treated with prednisone taper and Toradol. Reporting he has contacted orthopedics and they have not scheduled him appointment at this point. X-ray here without any acute findings.  Very mild dextroconvex curvature the lower thoracic spine. No concerning red flags on exam. Will start on meloxicam daily and do Flexeril for muscle relaxant Recommend rest and follow-up with sports medicine Final Clinical Impressions(s) / UC Diagnoses   Final diagnoses:  Acute midline thoracic back pain     Discharge Instructions     Your x-ray not show anything concerning. We will try a different anti-inflammatory and muscle ask her to see if this helps. Recommend following up with sports medicine or ortho for further management    ED Prescriptions    Medication Sig Dispense Auth. Provider   cyclobenzaprine (FLEXERIL) 10 MG tablet Take 1 tablet (10 mg total) by mouth 2 (two) times daily as needed for muscle spasms. 20 tablet Ninette Cotta A, NP   meloxicam (MOBIC) 7.5 MG tablet Take 1 tablet (7.5 mg total) by mouth daily. 30 tablet Dahlia Byes A, NP     PDMP not reviewed this encounter.   Dahlia Byes A, NP 04/18/20 1027

## 2020-04-23 ENCOUNTER — Encounter (HOSPITAL_COMMUNITY): Payer: Self-pay | Admitting: Psychiatry

## 2020-04-23 ENCOUNTER — Telehealth (INDEPENDENT_AMBULATORY_CARE_PROVIDER_SITE_OTHER): Payer: 59 | Admitting: Psychiatry

## 2020-04-23 DIAGNOSIS — F5102 Adjustment insomnia: Secondary | ICD-10-CM

## 2020-04-23 DIAGNOSIS — F331 Major depressive disorder, recurrent, moderate: Secondary | ICD-10-CM

## 2020-04-23 DIAGNOSIS — F411 Generalized anxiety disorder: Secondary | ICD-10-CM

## 2020-04-23 MED ORDER — TRAZODONE HCL 50 MG PO TABS
50.0000 mg | ORAL_TABLET | Freq: Every day | ORAL | 0 refills | Status: DC
Start: 2020-04-23 — End: 2020-05-25

## 2020-04-23 MED ORDER — ESCITALOPRAM OXALATE 10 MG PO TABS: 10.0000 mg | ORAL_TABLET | Freq: Every day | ORAL | 0 refills | Status: DC
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
## 2020-05-21 ENCOUNTER — Other Ambulatory Visit (HOSPITAL_COMMUNITY): Payer: Self-pay | Admitting: Psychiatry

## 2020-05-25 ENCOUNTER — Other Ambulatory Visit (HOSPITAL_COMMUNITY): Payer: Self-pay

## 2020-05-25 MED ORDER — TRAZODONE HCL 50 MG PO TABS: 50.0000 mg | ORAL_TABLET | Freq: Every day | ORAL | 0 refills | Status: DC

## 2020-05-29 ENCOUNTER — Telehealth (HOSPITAL_COMMUNITY): Payer: 59 | Admitting: Psychiatry

## 2020-06-01 ENCOUNTER — Telehealth (HOSPITAL_COMMUNITY): Payer: 59 | Admitting: Psychiatry

## 2020-06-28 ENCOUNTER — Other Ambulatory Visit (HOSPITAL_COMMUNITY): Payer: Self-pay | Admitting: Psychiatry

## 2020-08-03 ENCOUNTER — Emergency Department (HOSPITAL_COMMUNITY)
Admission: EM | Admit: 2020-08-03 | Discharge: 2020-08-03 | Disposition: A | Discharge status: Home / Self Care | Payer: No Typology Code available for payment source | Attending: Emergency Medicine | Admitting: Emergency Medicine

## 2020-08-03 ENCOUNTER — Other Ambulatory Visit: Payer: Self-pay

## 2020-08-03 DIAGNOSIS — T50901A Poisoning by unspecified drugs, medicaments and biological substances, accidental (unintentional), initial encounter: Secondary | ICD-10-CM

## 2020-08-03 DIAGNOSIS — F17290 Nicotine dependence, other tobacco product, uncomplicated: Secondary | ICD-10-CM | POA: Diagnosis not present

## 2020-08-03 DIAGNOSIS — R000 Tachycardia, unspecified: Secondary | ICD-10-CM | POA: Insufficient documentation

## 2020-08-03 DIAGNOSIS — T43621A Poisoning by amphetamines, accidental (unintentional), initial encounter: Secondary | ICD-10-CM | POA: Diagnosis present

## 2020-08-03 MED ORDER — NARCAN 4 MG/0.1ML NA LIQD: 1.0000 | NASAL | 0 refills | Status: DC | PRN

## 2020-08-03 NOTE — ED Notes (Signed)
Pt refused any care, labs, and vitals. MD notified.

## 2020-08-03 NOTE — Discharge Instructions (Signed)
You were treated for a drug overdose by first responders.  The Narcan given to you could wear off soon, resulting in a recurrence of your condition.  This could be life-threatening.  You are currently leaving AGAINST MEDICAL ADVICE.  However if you change your mind at any time or feel worse or any other new/concerning symptoms then you are encouraged to return to the ER or call 911 as soon as possible.

## 2020-08-03 NOTE — ED Provider Notes (Signed)
[facility] EMERGENCY DEPARTMENT Provider Note   CSN: [id number] Arrival date & time: 08/03/20  2947     History Chief Complaint  Patient presents with  . Drug Overdose    Nathan Simon is a 39 y.o. male.  HPI 39 year old male presents with drug overdose.  History is primarily from EMS and police but also from the patient.  According to EMS, the patient was parked in the bank parking lot and staff saw him not moving in his car.  They checked on him and he was unresponsive.  EMS gave 2 g intranasal Narcan and he has slowly come around.  Patient tells me that he did take some ecstasy this morning around 330.  He denies any other drugs or alcohol.  He states that someone got in his car at a gas station and was in his car at the bank.  He otherwise denies acute illness.   Past Medical History:  Diagnosis Date  . Back ache     Patient Active Problem List   Diagnosis Date Noted  . Bilateral leg edema 04/27/2019  . Chronic low back pain without sciatica 04/27/2019    Past Surgical History:  Procedure Laterality Date  . APPENDECTOMY         Family History  Problem Relation Age of Onset  . Diabetes Mother   . Hypertension Mother   . Healthy Father     Social History   Tobacco Use  . Smoking status: Current Some Day Smoker    Types: Cigars  . Smokeless tobacco: Never Used  Substance Use Topics  . Alcohol use: Not Currently  . Drug use: Not on file    Home Medications Prior to Admission medications   Medication Sig Start Date End Date Taking? Authorizing Provider  cyclobenzaprine (FLEXERIL) 10 MG tablet Take 1 tablet (10 mg total) by mouth 2 (two) times daily as needed for muscle spasms. 04/18/20   Bast, Tressia Miners A, NP  escitalopram (LEXAPRO) 10 MG tablet TAKE 1 TABLET BY MOUTH EVERY DAY. FOR THE FIRST 4 DAYS, TAKE HALF A TABLET AND THEN CONTINUE TO TAKE 1 TABLET BY MOUTH EVERY DAY 05/28/20   Merian Capron, MD  meloxicam (MOBIC) 7.5 MG tablet Take 1  tablet (7.5 mg total) by mouth daily. 04/18/20   Orvan July, NP  naloxone (NARCAN) 4 MG/0.1ML LIQD nasal spray kit Place 1 spray into the nose as needed for up to 1 dose (Overdose). 08/03/20   Sherwood Gambler, MD  traZODone (DESYREL) 50 MG tablet Take 1 tablet (50 mg total) by mouth at bedtime. 05/25/20   Merian Capron, MD    Allergies    Patient has no known allergies.  Review of Systems   Review of Systems  Constitutional: Negative for fever.  Respiratory: Negative for shortness of breath.   Neurological: Negative for headaches.  All other systems reviewed and are negative.   Physical Exam Updated Vital Signs BP 130/88 (BP Location: Right Arm)   Pulse (!) 111   Resp 16   SpO2 99%   Physical Exam Vitals and nursing note reviewed.  Constitutional:      Appearance: He is well-developed.     Comments: Awake but groggy, mildly slurred speech  HENT:     Head: Normocephalic and atraumatic.     Right Ear: External ear normal.     Left Ear: External ear normal.     Nose: Nose normal.     Comments: NP airway in place in  left nostril Eyes:     General:        Right eye: No discharge.        Left eye: No discharge.     Extraocular Movements: Extraocular movements intact.     Pupils: Pupils are equal, round, and reactive to light.  Cardiovascular:     Rate and Rhythm: Regular rhythm. Tachycardia present.     Heart sounds: Normal heart sounds.  Pulmonary:     Effort: Pulmonary effort is normal.     Breath sounds: Normal breath sounds.  Abdominal:     Palpations: Abdomen is soft.     Tenderness: There is no abdominal tenderness.  Musculoskeletal:     Cervical back: Neck supple.  Skin:    General: Skin is warm and dry.  Neurological:     Mental Status: He is alert and oriented to person, place, and time.     Comments: CN 3-12 grossly intact. 5/5 strength in all 4 extremities. Grossly normal sensation.   Psychiatric:        Mood and Affect: Mood is not anxious.     ED  Results / Procedures / Treatments   Labs (all labs ordered are listed, but only abnormal results are displayed) Labs Reviewed  COMPREHENSIVE METABOLIC PANEL  ETHANOL  CBC WITH DIFFERENTIAL/PLATELET  RAPID URINE DRUG SCREEN, HOSP PERFORMED  CBG MONITORING, ED    EKG EKG Interpretation  Date/Time:  Friday August 03 2020 09:37:03 EDT Ventricular Rate:  102 PR Interval:    QRS Duration: 94 QT Interval:  372 QTC Calculation: 485 R Axis:   83 Text Interpretation: Sinus tachycardia Consider left ventricular hypertrophy Borderline prolonged QT interval No old tracing to compare Artifact Confirmed by Sherwood Gambler [phone]) on 08/03/2020 9:42:55 AM   Radiology No results found.  Procedures Procedures (including critical care time)  Medications Ordered in ED Medications - No data to display  ED Course  I have reviewed the triage vital signs and the nursing notes.  Pertinent labs & imaging results that were available during my care of the patient were reviewed by me and considered in my medical decision making (see chart for details).    MDM Rules/Calculators/A&P                          Patient is initially groggy in the emergency department though he has now acutely awakened.  He wants to leave.  He is not altered and can ambulate without difficulty.  My suspicion that he is currently intoxicated is very low.  He is refusing all blood work and at this point there is no indication to hold him against as well.  I did discuss the possibility that Narcan could wear off before what ever was in his system.  He seems understand this and understands that this could ultimately result in death.  Will prescribe a Narcan kit and advised he can return at any time. Final Clinical Impression(s) / ED Diagnoses Final diagnoses:  Accidental drug overdose, initial encounter    Rx / DC Orders ED Discharge Orders         Ordered    naloxone (NARCAN) 4 MG/0.1ML LIQD nasal spray kit  As needed         08/03/20 0955           Sherwood Gambler, MD 08/03/20 [phone]

## 2020-08-03 NOTE — ED Triage Notes (Addendum)
Pt to ED via EMS C/O OD pt  from the bank parking lot, apparently pt was sitting in driver side of vehicle, parked, bank manger called EMS. On arrival unresponsive,  2mg  narcan given nasally, Pt currently responsive and alert.  Pt denies drug use. [employer] at bedside currently. Last VS 113/78, pulse 96, 98%RA.

## 2020-08-07 ENCOUNTER — Ambulatory Visit (HOSPITAL_COMMUNITY)
Admission: AD | Admit: 2020-08-07 | Discharge: 2020-08-07 | Disposition: A | Discharge status: Home / Self Care | Payer: No Typology Code available for payment source

## 2020-08-07 ENCOUNTER — Other Ambulatory Visit: Payer: Self-pay

## 2020-08-07 DIAGNOSIS — R4586 Emotional lability: Secondary | ICD-10-CM

## 2020-08-07 NOTE — BH Assessment (Signed)
Comprehensive Clinical Assessment (CCA) Screening, Triage and Referral Note  08/07/2020 [facility] [id number]   Patient is a 39 y.o. male with a history of Bipolar Disorder who presented to [facility] for assessment.  Patient reports having episodes where he loses time and blacks out on occasion.  Patient states he had an episode this morning while driving.  He states he must have driven right past work to [employer].  He reports this was more of a "daydream" type episode, as he began driving aimlessly for over an hour.  He pulled off and began to realize he missed work and called his wife.  She encouraged patient to come in to discuss treatment options/medications that might be helpful, as episodes are occurring more frequently.  Patient also reports mood instability and he is currently prescribed Effexor and trazadone by Dr. Fleet Contras.  A sleep medication was added, as it was thought sleep deprivation may be causing the episodes.  Episodes have continued with improved sleep.  Patient denies SI, HI and AVH.  He is interested in medication options to stabilize his mood.   Per Reola Calkins, NP patient does not meet inpatient criteria.  He was encouraged to discuss medication options with current provider.  He was also provided with additional resources for psychiatrists in the area as requested.   Visit Diagnosis:    ICD-10-CM   1. Mood changes  R45.86     Patient Reported Information How did you hear about [place]? Self   Referral name: No data recorded  Referral phone number: No data recorded Whom do you see for routine medical problems? I don't have a doctor   Practice/Facility Name: No data recorded  Practice/Facility Phone Number: No data recorded  Name of Contact: No data recorded  Contact Number: No data recorded  Contact Fax Number: No data recorded  Prescriber Name: No data recorded  Prescriber Address (if known): No data recorded What Is  the Reason for Your Visit/Call Today? Patient presents with worsening black out episodes for the past month.  He also reports mood instability.  Patient diagnosed with Bipolar and currently not on a mood stabilizer.  How Long Has This Been Causing You Problems? 1-6 months  Have You Recently Been in Any Inpatient Treatment (Hospital/Detox/Crisis Center/28-Day Program)? No   Name/Location of Program/Hospital:No data recorded  How Long Were You There? No data recorded  When Were You Discharged? No data recorded Have You Ever Received Services From [facility] Before? Yes   Who Do You See at [facility]? sees Dr. Fleet Contras on [facility]  Have You Recently Had Any Thoughts About Hurting Yourself? No   Are You Planning to Commit Suicide/Harm Yourself At This time?  No  Have you Recently Had Thoughts About Hurting Someone Karolee Ohs? No   Explanation: No data recorded Have You Used Any Alcohol or Drugs in the Past 24 Hours? Yes   How Long Ago Did You Use Drugs or Alcohol?  No data recorded  What Did You Use and How Much? alcohol  - pint of vodka - over 2 day period  What Do You Feel Would Help You the Most Today? Medication  Do You Currently Have a Therapist/Psychiatrist? Yes   Name of Therapist/Psychiatrist: Dr. Mila Palmer psychiatrist [facility] outpatient   Have You Been Recently Discharged From Any Office Practice or Programs? No   Explanation of Discharge From Practice/Program:  No data recorded    CCA Screening Triage Referral  Assessment Type of Contact: Face-to-Face   Is this Initial or Reassessment? No data recorded  Date Telepsych consult ordered in [facility]:  No data recorded  Time Telepsych consult ordered in [facility]:  No data recorded Patient Reported Information Reviewed? Yes   Patient Left Without Being Seen? No data recorded  Reason for Not Completing Assessment: No data recorded Collateral Involvement: N/A  Does Patient Have a Court Appointed Legal Guardian? No data  recorded  Name and Contact of Legal Guardian:  No data recorded If Minor and Not Living with Parent(s), Who has Custody? No data recorded Is CPS involved or ever been involved? Never  Is APS involved or ever been involved? Never  Patient Determined To Be At Risk for Harm To Self or Others Based on Review of Patient Reported Information or Presenting Complaint? No   Method: No Plan   Availability of Means: No access or NA   Intent: Vague intent or NA   Notification Required: No need or identified person   Additional Information for Danger to Others Potential:  No data recorded  Additional Comments for Danger to Others Potential:  No data recorded  Are There Guns or Other Weapons in Your Home?  Yes    Types of Guns/Weapons: rifle    Are These Weapons Safely Secured?                              Yes    Who Could Verify You Are Able To Have These Secured:    No data recorded Do You Have any Outstanding Charges, Pending Court Dates, Parole/Probation? No data recorded Contacted To Inform of Risk of Harm To Self or Others: No data recorded Location of Assessment: [facility] Services  Does Patient Present under Involuntary Commitment? No   IVC Papers Initial File Date: No data recorded  [place] of Residence: [place]  Patient Currently Receiving the Following Services: Medication Management   Determination of Need: Routine (7 days)   Options For Referral: Medication Management;Outpatient Therapy   Yetta Glassman

## 2020-08-07 NOTE — Discharge Instructions (Addendum)
Patient is instructed prior to discharge to: Take all medications as prescribed by his/her mental healthcare provider. Report any adverse effects and or reactions from the medicines to his/her outpatient provider promptly. Patient has been instructed & cautioned: To not engage in alcohol and or illegal drug use while on prescription medicines. In the event of worsening symptoms, patient is instructed to call the crisis hotline, 911 and or go to the nearest ED for appropriate evaluation and treatment of symptoms. To follow-up with his/her primary care provider for your other medical issues, concerns and or health care needs. Seek primary care provider    You have been provided with referral information for outpatient psychiatrists, should you decide to find a new provider.

## 2020-08-07 NOTE — ED Notes (Signed)
Pt discharged in no acute distress. Verbalized understanding of all discharge instructions reviewed by Clinical research associate. Pt escorted to lobby to wife for transport to home. Safety maintained.

## 2020-08-07 NOTE — ED Provider Notes (Signed)
Behavioral Health Urgent Care Medical Screening Exam  Patient Name: Nathan Simon MRN: [id number] Date of Evaluation: 08/07/20 Chief Complaint:   Diagnosis:  Final diagnoses:  Mood changes    History of Present illness: Nathan Simon is a 39 y.o. male who presented to the [facility] with his wife with complaints of mood instability and blackouts. He described his mood as episodes of "obsessing about stuff, and not sleeping" daily, at least three times per week. He reported that he takes his sleep medication and then feels sleepy the next day. He reported "my bipolar medications makes me feel detatched." He wanted to know if he could take half of the medication. He reported following up with a "Dr. Dorma Russell "virtually and is treated for bipolar." He reported that he's prescribed "Effexor and Thorazine for bipolar."  Per chart review, the patient sees Dr. Gilmore Laroche and was last seen on 04/23/20 and is prescribed Lexapro 10 mg PO daily and Trazodone 50 mg PO at bedtime for MDD and GAD. He reported that he's been experiencing blackouts and described them as "dazing out and day dreaming." He reported he missed work today because he was driving and ended up driving passed the bank because he was day dreaming. He is now requesting a note for missing work today.  Per chart review, the patient had a similar episode on 08/03/20 where he was found at the bank unresponsive from an accidental  drug overdose. At that time, he was given Narcan. At that time, he reported using some ecstasy. Today, he denies using any illicit substances and reported that the last time he used drugs was on 08/03/20. He was unable to further elaborate on the incident that took place on 08/03/20 and stated that he was unable to recall what happened.   He is alert and oriented. He is calm, cooperative and answers questions appropriately during the assessment. He denies SI/HI. He denies AVH. He does not appear to be  responding to internal stimuli. He denies blacking out today, and reports having "day dreams". He denies wrecking his car or causing physical injury while day dreaming today on his way to work. He was recommended to follow up with his psychiatrist about medication changes. He was encouraged to follow up with a primary care physician if he continues to experience episodes of blackouts.   At this time, the patient does not meet criteria for inpatient treatment. There is no evidence that the patient is a safety risk to himself, or others.   Psychiatric Specialty Exam  Presentation  General Appearance:Appropriate for Environment  Eye Contact:Fair  Speech:Clear and Coherent  Speech Volume:Normal  Handedness:Right   Mood and Affect  Mood:Anxious  Affect:Appropriate   Thought Process  Thought Processes:Coherent  Descriptions of Associations:Intact  Orientation:Full (Time, Place and Person)  Thought Content:Logical  Hallucinations:None  Ideas of Reference:None  Suicidal Thoughts:No  Homicidal Thoughts:No   Sensorium  Memory:Immediate Fair;Recent Fair;Remote Fair  Judgment:Fair  Insight:Fair   Executive Functions  Concentration:Fair  Attention Span:Fair  Recall:Fair  Fund of Knowledge:Fair  Language:Fair   Psychomotor Activity  Psychomotor Activity:Normal   Assets  Assets:Communication Skills;Desire for Improvement;Intimacy;Housing;Leisure Time;Physical Health   Sleep  Sleep:Fair  Number of hours: No data recorded  Physical Exam: Physical Exam Vitals and nursing note reviewed.  Constitutional:      Appearance: He is well-developed.  HENT:     Head: Normocephalic.  Eyes:     Pupils: Pupils are equal, round, and reactive to light.  Cardiovascular:     Rate and Rhythm: Normal rate.  Pulmonary:     Effort: Pulmonary effort is normal.  Musculoskeletal:        General: Normal range of motion.  Neurological:     Mental Status: He is alert and  oriented to person, place, and time.    Review of Systems  Constitutional: Negative.   HENT: Negative.   Eyes: Negative.   Respiratory: Negative.   Cardiovascular: Negative.   Gastrointestinal: Negative.   Genitourinary: Negative.   Musculoskeletal: Negative.   Skin: Negative.   Neurological: Negative.   Endo/Heme/Allergies: Negative.   Psychiatric/Behavioral:       Mood instability.    Blood pressure 109/80, pulse 81, temperature 97.9 F (36.6 C), temperature source Oral, resp. rate 18, height 6' (1.829 m), weight 205 lb (93 kg), SpO2 95 %. Body mass index is 27.8 kg/m.  Musculoskeletal: Strength & Muscle Tone: within normal limits Gait & Station: normal Patient leans: N/A   [facility] MSE Discharge Disposition for Follow up and Recommendations: Based on my evaluation the patient does not appear to have an emergency medical condition and can be discharged with resources and follow up care in outpatient services for Medication Management   Gerlene Burdock Abbeygail Igoe, FNP 08/07/2020, 2:42 PM

## 2020-08-07 NOTE — ED Notes (Signed)
No belongings

## 2020-10-18 ENCOUNTER — Ambulatory Visit (HOSPITAL_COMMUNITY)
Admission: EM | Admit: 2020-10-18 | Discharge: 2020-10-18 | Disposition: A | Discharge status: Home / Self Care | Payer: 59 | Attending: Psychiatry | Admitting: Psychiatry

## 2020-10-18 ENCOUNTER — Other Ambulatory Visit: Payer: Self-pay

## 2020-10-18 DIAGNOSIS — F39 Unspecified mood [affective] disorder: Secondary | ICD-10-CM

## 2020-10-18 NOTE — Discharge Instructions (Signed)

## 2020-10-18 NOTE — ED Triage Notes (Signed)
Patient states "My whole family has a history of bipolar, depression and I been off my medications and my uncle died and then my aunt died, From the way the medication made my dad act I did not want to take my medication. Patient has been off his medications for about month and half. Patient states he is having SI thoughts, and HI thoughts towards wife who is in the lobby because she convince me not to hurt them and I can't stand look at her. I came in on my own. Patient is not able to contract for safety. Patient states he drinks alcohol about liquor unable to specify amount of use. Patient used an ecstasy yesterday. Patient is not sleeping and appetite is poor. Patient is not able to concentrate.
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
## 2020-10-18 NOTE — Discharge Summary (Signed)
Nathan Simon to be D/C'd Home per  NP   order.  Discussed with the patient and all questions fully answered.  VSS, Skin clean, dry and intact without evidence of skin break down, no evidence of skin tears noted. IAn After Visit Summary was printed and given to the patient.  D/c education completed with patient including follow up instructions, medication list, d/c activities limitations if indicated, with other d/c instructions as indicated by NP - patient able to verbalize understanding, all questions fully answered.   Patient instructed to return to ED, call 911, or call MD for any changes in condition.   Patient D/C home via private auto.  [name] 10/18/2020 11:37 AM

## 2021-09-13 ENCOUNTER — Encounter (HOSPITAL_COMMUNITY): Payer: Self-pay

## 2021-09-13 ENCOUNTER — Ambulatory Visit (INDEPENDENT_AMBULATORY_CARE_PROVIDER_SITE_OTHER): Payer: BC Managed Care – PPO

## 2021-09-13 ENCOUNTER — Other Ambulatory Visit: Payer: Self-pay

## 2021-09-13 ENCOUNTER — Ambulatory Visit (HOSPITAL_COMMUNITY)
Admission: EM | Admit: 2021-09-13 | Discharge: 2021-09-13 | Disposition: A | Discharge status: Home / Self Care | Payer: BC Managed Care – PPO | Attending: Student | Admitting: Student

## 2021-09-13 DIAGNOSIS — S2020XA Contusion of thorax, unspecified, initial encounter: Secondary | ICD-10-CM

## 2021-09-13 DIAGNOSIS — S20212A Contusion of left front wall of thorax, initial encounter: Secondary | ICD-10-CM

## 2021-09-13 DIAGNOSIS — W19XXXA Unspecified fall, initial encounter: Secondary | ICD-10-CM | POA: Diagnosis not present

## 2021-09-13 MED ORDER — IBUPROFEN 800 MG PO TABS
800.0000 mg | ORAL_TABLET | Freq: Once | ORAL | Status: AC
  Administered 2021-09-13: 800 mg via ORAL

## 2021-09-13 MED ORDER — TIZANIDINE HCL 2 MG PO TABS: 2.0000 mg | ORAL_TABLET | Freq: Three times a day (TID) | ORAL | 0 refills | Status: DC | PRN

## 2021-09-13 MED ORDER — IBUPROFEN 800 MG PO TABS
ORAL_TABLET | ORAL | Status: AC
  Filled 2021-09-13: qty 1

## 2021-09-13 MED ORDER — IBUPROFEN 800 MG PO TABS
800.0000 mg | ORAL_TABLET | Freq: Three times a day (TID) | ORAL | 0 refills | Status: DC
Start: 2021-09-13 — End: 2022-12-01
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
## 2021-09-13 NOTE — ED Provider Notes (Signed)
[place]    CSN: [id number] Arrival date & time: 09/13/21  1432      History   Chief Complaint Chief Complaint  Patient presents with   Fall    HPI Nathan Simon is a 40 y.o. male presenting with left-sided rib pain following a fall that occurred 1 day ago.  Medical history chronic low back pain.  States that he was walking down a gently sloped hill when he slipped and fell backwards, left side hitting a tree stump.  Now with significant pain over the lateral left ribs, worse with movement and deep inspiration.  Denies shortness of breath.  Has not taken any medications for the symptoms. Denies pain or injury elsewhere. Denies head trauma, LOC, headaches, dizziness. Denies abd pain, hematuria, change in bowel or bladder function.  HPI  Past Medical History:  Diagnosis Date   Back ache     Patient Active Problem List   Diagnosis Date Noted   Bilateral leg edema 04/27/2019   Chronic low back pain without sciatica 04/27/2019    Past Surgical History:  Procedure Laterality Date   APPENDECTOMY         Home Medications    Prior to Admission medications   Medication Sig Start Date End Date Taking? Authorizing Provider  ibuprofen (ADVIL) 800 MG tablet Take 1 tablet (800 mg total) by mouth 3 (three) times daily. 09/13/21  Yes Hazel Sams, PA-C  tiZANidine (ZANAFLEX) 2 MG tablet Take 1 tablet (2 mg total) by mouth every 8 (eight) hours as needed for muscle spasms. 09/13/21  Yes Hazel Sams, PA-C    Family History Family History  Problem Relation Age of Onset   Diabetes Mother    Hypertension Mother    Healthy Father     Social History Social History   Tobacco Use   Smoking status: Some Days    Types: Cigars   Smokeless tobacco: Never  Substance Use Topics   Alcohol use: Not Currently     Allergies   Patient has no known allergies.   Review of Systems Review of Systems  Musculoskeletal:  Positive for back pain.       L rib  pain   All other systems reviewed and are negative.   Physical Exam Triage Vital Signs ED Triage Vitals  Enc Vitals Group     BP 09/13/21 1526 127/89     Pulse Rate 09/13/21 1526 67     Resp 09/13/21 1526 18     Temp 09/13/21 1526 98.2 F (36.8 C)     Temp Source 09/13/21 1526 Oral     SpO2 09/13/21 1526 99 %     Weight --      Height --      Head Circumference --      Peak Flow --      Pain Score 09/13/21 1530 8     Pain Loc --      Pain Edu? --      Excl. in [place]? --    No data found.  Updated Vital Signs BP 127/89 (BP Location: Left Arm)   Pulse 67   Temp 98.2 F (36.8 C) (Oral)   Resp 18   SpO2 99%   Visual Acuity Right Eye Distance:   Left Eye Distance:   Bilateral Distance:    Right Eye Near:   Left Eye Near:    Bilateral Near:     Physical Exam Vitals reviewed.  Constitutional:  General: He is not in acute distress.    Appearance: Normal appearance. He is not ill-appearing.  HENT:     Head: Normocephalic and atraumatic.  Cardiovascular:     Rate and Rhythm: Normal rate and regular rhythm.     Heart sounds: Normal heart sounds.  Pulmonary:     Effort: Pulmonary effort is normal.     Breath sounds: Normal breath sounds and air entry.     Comments: Breath sounds full throughout  Abdominal:     Tenderness: There is no abdominal tenderness. There is no right CVA tenderness, left CVA tenderness, guarding or rebound.  Musculoskeletal:     Cervical back: Normal range of motion. No swelling, deformity, signs of trauma, rigidity, spasms, tenderness, bony tenderness or crepitus. No pain with movement.     Thoracic back: No swelling, deformity, signs of trauma, spasms, tenderness or bony tenderness. Normal range of motion. No scoliosis.     Lumbar back: No swelling, deformity, signs of trauma, spasms, tenderness or bony tenderness. Normal range of motion. Negative right straight leg raise test and negative left straight leg raise test. No scoliosis.      Comments: L lateral distal rib pain, without bony deformity or skin changes. Pain with movement and deep inspiration. No midline spinous tenderness, deformity, stepoff. No snuffbox tenderness. No hip or pelvic instability.   Absolutely no other injury, deformity, tenderness, ecchymosis, abrasion.  Neurological:     General: No focal deficit present.     Mental Status: He is alert.     Cranial Nerves: No cranial nerve deficit.  Psychiatric:        Mood and Affect: Mood normal.        Behavior: Behavior normal.        Thought Content: Thought content normal.        Judgment: Judgment normal.     UC Treatments / Results  Labs (all labs ordered are listed, but only abnormal results are displayed) Labs Reviewed - No data to display  EKG   Radiology DG Ribs Unilateral W/Chest Left  Result Date: 09/13/2021 CLINICAL DATA:  Fall onto tree stump, now with left lateral rib pain EXAM: LEFT RIBS AND CHEST - 3+ VIEW COMPARISON:  None, correlation is made with thoracic spine radiographs 04/18/2020. FINDINGS: No fracture or other bone lesions are seen involving the ribs. There is no evidence of pneumothorax or pleural effusion. Both lungs are clear. Heart size and mediastinal contours are within normal limits. IMPRESSION: Negative. Electronically Signed   By: Merilyn Baba M.D.   On: 09/13/2021 15:54    Procedures Procedures (including critical care time)  Medications Ordered in UC Medications  ibuprofen (ADVIL) tablet 800 mg (800 mg Oral Given 09/13/21 1555)    Initial Impression / Assessment and Plan / UC Course  I have reviewed the triage vital signs and the nursing notes.  Pertinent labs & imaging results that were available during my care of the patient were reviewed by me and considered in my medical decision making (see chart for details).     This patient is a very pleasant 40 y.o. year old male presenting with rib contusion following fall.  Xray L ribs- negative.  Ibuprofen,  zanaflex, work note provided.   ED return precautions discussed. Patient verbalizes understanding and agreement.    Final Clinical Impressions(s) / UC Diagnoses   Final diagnoses:  Contusion of rib on left side, initial encounter     Discharge Instructions      -Your  ribs aren't broken, but they are bruised. This is very painful and can take several weeks to heal. -You can take Tylenol up to 1000 mg 3 times daily, and ibuprofen up to 800 mg 3 times daily with food.  You can take these together, or alternate every 3-4 hours. -Start the muscle relaxer-Zanaflex (tizanidine), up to 3 times daily for muscle spasms and pain.  This can make you drowsy, so take at bedtime or when you do not need to drive or operate machinery. -Heating pad, rest -Don't resume work until pain has resolved.      ED Prescriptions     Medication Sig Dispense Auth. Provider   tiZANidine (ZANAFLEX) 2 MG tablet Take 1 tablet (2 mg total) by mouth every 8 (eight) hours as needed for muscle spasms. 21 tablet Rhys Martini, PA-C   ibuprofen (ADVIL) 800 MG tablet Take 1 tablet (800 mg total) by mouth 3 (three) times daily. 21 tablet Rhys Martini, PA-C      PDMP not reviewed this encounter.   Rhys Martini, PA-C 09/13/21 1630

## 2021-09-13 NOTE — ED Triage Notes (Signed)
Pt presents with left side rib pain from a fall against tree branches & roots last night.

## 2022-10-03 ENCOUNTER — Other Ambulatory Visit: Payer: Self-pay | Admitting: Internal Medicine

## 2022-10-03 DIAGNOSIS — Z131 Encounter for screening for diabetes mellitus: Secondary | ICD-10-CM | POA: Diagnosis not present

## 2022-10-03 DIAGNOSIS — Z0000 Encounter for general adult medical examination without abnormal findings: Secondary | ICD-10-CM | POA: Diagnosis not present

## 2022-10-03 DIAGNOSIS — Z13220 Encounter for screening for lipoid disorders: Secondary | ICD-10-CM | POA: Diagnosis not present

## 2022-10-03 DIAGNOSIS — G4700 Insomnia, unspecified: Secondary | ICD-10-CM | POA: Diagnosis not present

## 2022-10-03 DIAGNOSIS — F329 Major depressive disorder, single episode, unspecified: Secondary | ICD-10-CM | POA: Diagnosis not present

## 2022-10-03 DIAGNOSIS — Z125 Encounter for screening for malignant neoplasm of prostate: Secondary | ICD-10-CM | POA: Diagnosis not present

## 2022-10-03 DIAGNOSIS — F5221 Male erectile disorder: Secondary | ICD-10-CM | POA: Diagnosis not present

## 2022-10-03 DIAGNOSIS — M545 Low back pain, unspecified: Secondary | ICD-10-CM | POA: Diagnosis not present

## 2022-10-03 DIAGNOSIS — E559 Vitamin D deficiency, unspecified: Secondary | ICD-10-CM | POA: Diagnosis not present

## 2022-10-08 LAB — CBC
HCT: 41.5 % (ref 38.5–50.0)
Hemoglobin: 14.9 g/dL (ref 13.2–17.1)
MCH: 32.5 pg (ref 27.0–33.0)
MCHC: 35.9 g/dL (ref 32.0–36.0)
MCV: 90.4 fL (ref 80.0–100.0)
MPV: 11.4 fL (ref 7.5–12.5)
Platelets: 248 10*3/uL (ref 140–400)
RBC: 4.59 10*6/uL (ref 4.20–5.80)
RDW: 12.1 % (ref 11.0–15.0)
WBC: 4.5 10*3/uL (ref 3.8–10.8)

## 2022-10-08 LAB — VITAMIN D 25 HYDROXY (VIT D DEFICIENCY, FRACTURES): Vit D, 25-Hydroxy: 8 ng/mL — ABNORMAL LOW (ref 30–100)

## 2022-10-08 LAB — TSH: TSH: 1.19 mIU/L (ref 0.40–4.50)

## 2022-10-08 LAB — COMPLETE METABOLIC PANEL WITH GFR
AG Ratio: 1.5 (calc) (ref 1.0–2.5)
ALT: 13 U/L (ref 9–46)
AST: 14 U/L (ref 10–40)
Albumin: 4.5 g/dL (ref 3.6–5.1)
Alkaline phosphatase (APISO): 76 U/L (ref 36–130)
BUN: 12 mg/dL (ref 7–25)
CO2: 23 mmol/L (ref 20–32)
Calcium: 9.5 mg/dL (ref 8.6–10.3)
Chloride: 104 mmol/L (ref 98–110)
Creat: 0.92 mg/dL (ref 0.60–1.29)
Globulin: 3.1 g/dL (calc) (ref 1.9–3.7)
Glucose, Bld: 90 mg/dL (ref 65–99)
Potassium: 4.4 mmol/L (ref 3.5–5.3)
Sodium: 138 mmol/L (ref 135–146)
Total Bilirubin: 0.5 mg/dL (ref 0.2–1.2)
Total Protein: 7.6 g/dL (ref 6.1–8.1)
eGFR: 106 mL/min/{1.73_m2} (ref >=60)

## 2022-10-08 LAB — PSA: PSA: 1.04 ng/mL (ref <=4.00)

## 2022-10-08 LAB — LIPID PANEL
Cholesterol: 196 mg/dL (ref <200)
HDL: 52 mg/dL (ref >=40)
LDL Cholesterol (Calc): 127 mg/dL (calc) — ABNORMAL HIGH
Non-HDL Cholesterol (Calc): 144 mg/dL (calc) — ABNORMAL HIGH (ref <130)
Total CHOL/HDL Ratio: 3.8 (calc) (ref <5.0)
Triglycerides: 76 mg/dL (ref <150)

## 2022-10-08 LAB — TESTOSTERONE, FREE: TESTOSTERONE FREE: 84.5 pg/mL (ref 46.0–224.0)

## 2022-10-10 ENCOUNTER — Other Ambulatory Visit: Payer: Self-pay | Admitting: Internal Medicine

## 2022-10-10 DIAGNOSIS — M545 Low back pain, unspecified: Secondary | ICD-10-CM

## 2022-11-03 DIAGNOSIS — M545 Low back pain, unspecified: Secondary | ICD-10-CM | POA: Diagnosis not present

## 2022-11-17 DIAGNOSIS — M545 Low back pain, unspecified: Secondary | ICD-10-CM | POA: Diagnosis not present

## 2022-11-17 DIAGNOSIS — F319 Bipolar disorder, unspecified: Secondary | ICD-10-CM | POA: Diagnosis not present

## 2022-11-17 DIAGNOSIS — G4700 Insomnia, unspecified: Secondary | ICD-10-CM | POA: Diagnosis not present

## 2022-11-17 DIAGNOSIS — N3000 Acute cystitis without hematuria: Secondary | ICD-10-CM | POA: Diagnosis not present

## 2022-11-26 ENCOUNTER — Ambulatory Visit (HOSPITAL_COMMUNITY)
Admission: EM | Admit: 2022-11-26 | Discharge: 2022-11-26 | Disposition: A | Discharge status: Home / Self Care | Payer: BC Managed Care – PPO | Attending: Family Medicine | Admitting: Family Medicine

## 2022-11-26 ENCOUNTER — Encounter (HOSPITAL_COMMUNITY): Payer: Self-pay

## 2022-11-26 DIAGNOSIS — R0789 Other chest pain: Secondary | ICD-10-CM | POA: Diagnosis not present

## 2022-11-26 DIAGNOSIS — R319 Hematuria, unspecified: Secondary | ICD-10-CM

## 2022-11-26 LAB — POCT URINALYSIS DIPSTICK, ED / UC
Bilirubin Urine: NEGATIVE
Glucose, UA: NEGATIVE mg/dL
Ketones, ur: NEGATIVE mg/dL
Leukocytes,Ua: NEGATIVE
Nitrite: NEGATIVE
Protein, ur: NEGATIVE mg/dL
Specific Gravity, Urine: 1.02 (ref 1.005–1.030)
Urobilinogen, UA: 0.2 mg/dL (ref 0.0–1.0)
pH: 7.5 (ref 5.0–8.0)

## 2022-11-26 MED ORDER — HYDROCODONE-ACETAMINOPHEN 5-325 MG PO TABS: 1.0000 | ORAL_TABLET | Freq: Four times a day (QID) | ORAL | 0 refills | Status: DC | PRN

## 2022-11-26 MED ORDER — PREDNISONE 20 MG PO TABS: 40.0000 mg | ORAL_TABLET | Freq: Every day | ORAL | 0 refills | Status: DC

## 2022-11-26 NOTE — Discharge Instructions (Addendum)
Your urine sample showed a trace amount of blood in your urine. This can be caused by many things. I would advise you follow up with your primary care doctor to repeat a urine sample in the near future.  Be aware, you have been prescribed pain medications that may cause drowsiness. While taking this medication, do not take any other medications containing acetaminophen (Tylenol). Do not combine with alcohol or recreational drugs. Please do not drive, operate heavy machinery, or take part in activities that require making important decisions while on this medication as your judgement may be clouded.

## 2022-11-26 NOTE — ED Triage Notes (Signed)
Chief Complaint: left flank pain. Patient has pulled a muscle in the back before and states that what this feels like. Patient was twisting to pop his back when the pain started. No urinary symptoms, no odor, pain, or smell. Patient bruised the left ribs 1 year ago.   Onset: 2 days   Prescriptions or OTC medications tried: Yes- Diclofenac     with no relief

## 2022-11-27 NOTE — ED Provider Notes (Signed)
[place]   [id number] 11/26/22 Arrival Time: 7412  ASSESSMENT & PLAN:  1. Left-sided chest wall pain   2. Hematuria, unspecified type    No resp distress. No concern for cardiac cause of pain. Is MSK.  Meds ordered this encounter  Medications   predniSONE (DELTASONE) 20 MG tablet    Sig: Take 2 tablets (40 mg total) by mouth daily.    Dispense:  14 tablet    Refill:  0   HYDROcodone-acetaminophen (NORCO/VICODIN) 5-325 MG tablet    Sig: Take 1 tablet by mouth every 6 (six) hours as needed for moderate pain or severe pain.    Dispense:  8 tablet    Refill:  0   Chest pain precautions given. Work note provided.  Hematuria noted. Very low suspicion of kidney stone. Recommend:  Follow-up Information     Schedule an appointment as soon as possible for a visit  with Nolene Ebbs, MD.   Specialty: Internal Medicine Why: To recheck a urine sample. Contact information: [place] [address] [phone]                 [place] Controlled Substances Registry consulted for this patient. I feel the risk/benefit ratio today is favorable for proceeding with this prescription for a controlled substance. Medication sedation precautions given.  Reviewed expectations re: course of current medical issues. Questions answered. Outlined signs and symptoms indicating need for more acute intervention. Patient verbalized understanding. After Visit Summary given.   SUBJECTIVE: History from: patient. Nathan Simon is a 42 y.o. male who presents with complaint of L sided chest wall pain; mid-axillary; "Feels like I pulled something". lower. Was twisting to pop his back when the pain started. Questioned mild flank pain but short lived; none now. No urinary symptoms, no odor, pain, or smell. Patient bruised the left ribs 1 year ago.  No tx PTA.  Social History   Tobacco Use  Smoking Status Some Days   Types: Cigars  Smokeless Tobacco Never   Social  History   Substance and Sexual Activity  Alcohol Use Not Currently    OBJECTIVE:  Vitals:   11/26/22 1831  BP: (!) 138/94  Pulse: 85  Resp: 16  Temp: 98.1 F (36.7 C)  TempSrc: Oral  SpO2: 95%  Weight: 90.7 kg  Height: 6' (1.829 m)    General appearance: alert, oriented, no acute distress Eyes: PERRLA; EOMI; conjunctivae normal HENT: normocephalic; atraumatic Neck: supple with FROM Lungs: without labored respirations; speaks full sentences without difficulty; CTAB Chest wall: very TTP over mid-axillary lower LEFT chest wall; no overlying bruising or deformities Heart: regular rate and rhythm Abdomen: soft, non-tender; no guarding or rebound tenderness Extremities: without edema; without calf swelling or tenderness; symmetrical without gross deformities Skin: warm and dry; without rash or lesions Neuro: normal gait Psychological: alert and cooperative; normal mood and affect  Labs: Results for orders placed or performed during the hospital encounter of 11/26/22  POC Urinalysis dipstick  Result Value Ref Range   Glucose, UA NEGATIVE NEGATIVE mg/dL   Bilirubin Urine NEGATIVE NEGATIVE   Ketones, ur NEGATIVE NEGATIVE mg/dL   Specific Gravity, Urine 1.020 1.005 - 1.030   Hgb urine dipstick TRACE (A) NEGATIVE   pH 7.5 5.0 - 8.0   Protein, ur NEGATIVE NEGATIVE mg/dL   Urobilinogen, UA 0.2 0.0 - 1.0 mg/dL   Nitrite NEGATIVE NEGATIVE   Leukocytes,Ua NEGATIVE NEGATIVE   Labs Reviewed  POCT URINALYSIS DIPSTICK, ED / UC - Abnormal;  Notable for the following components:      Result Value   Hgb urine dipstick TRACE (*)    All other components within normal limits   No Known Allergies  Past Medical History:  Diagnosis Date   Back ache    Social History   Socioeconomic History   Marital status: Married    Spouse name: Not on file   Number of children: Not on file   Years of education: Not on file   Highest education level: Not on file  Occupational History   Not on  file  Tobacco Use   Smoking status: Some Days    Types: Cigars   Smokeless tobacco: Never  Substance and Sexual Activity   Alcohol use: Not Currently   Drug use: Not on file   Sexual activity: Not on file  Other Topics Concern   Not on file  Social History Narrative   ** Merged History Encounter **       Social Determinants of Health   Financial Resource Strain: Not on file  Food Insecurity: Not on file  Transportation Needs: Not on file  Physical Activity: Not on file  Stress: Not on file  Social Connections: Not on file  Intimate Partner Violence: Not on file   Family History  Problem Relation Age of Onset   Diabetes Mother    Hypertension Mother    Healthy Father    Past Surgical History:  Procedure Laterality Date   APPENDECTOMY        Vanessa Kick, MD 11/27/22 [phone]

## 2022-12-01 ENCOUNTER — Emergency Department (HOSPITAL_COMMUNITY): Payer: BC Managed Care – PPO

## 2022-12-01 ENCOUNTER — Other Ambulatory Visit: Payer: Self-pay

## 2022-12-01 ENCOUNTER — Encounter (HOSPITAL_COMMUNITY): Payer: Self-pay | Admitting: *Deleted

## 2022-12-01 ENCOUNTER — Emergency Department (HOSPITAL_COMMUNITY)
Admission: EM | Admit: 2022-12-01 | Discharge: 2022-12-01 | Disposition: A | Discharge status: Home / Self Care | Payer: BC Managed Care – PPO | Attending: Emergency Medicine | Admitting: Emergency Medicine

## 2022-12-01 DIAGNOSIS — R079 Chest pain, unspecified: Secondary | ICD-10-CM | POA: Diagnosis not present

## 2022-12-01 DIAGNOSIS — J9811 Atelectasis: Secondary | ICD-10-CM | POA: Diagnosis not present

## 2022-12-01 DIAGNOSIS — R0789 Other chest pain: Secondary | ICD-10-CM | POA: Diagnosis not present

## 2022-12-01 LAB — CBC WITH DIFFERENTIAL/PLATELET
Abs Immature Granulocytes: 0 10*3/uL (ref 0.00–0.07)
Basophils Absolute: 0 10*3/uL (ref 0.0–0.1)
Basophils Relative: 0 %
Eosinophils Absolute: 0.1 10*3/uL (ref 0.0–0.5)
Eosinophils Relative: 2 %
HCT: 43.7 % (ref 39.0–52.0)
Hemoglobin: 14.8 g/dL (ref 13.0–17.0)
Immature Granulocytes: 0 %
Lymphocytes Relative: 45 %
Lymphs Abs: 2.2 10*3/uL (ref 0.7–4.0)
MCH: 32.7 pg (ref 26.0–34.0)
MCHC: 33.9 g/dL (ref 30.0–36.0)
MCV: 96.7 fL (ref 80.0–100.0)
Monocytes Absolute: 0.6 10*3/uL (ref 0.1–1.0)
Monocytes Relative: 13 %
Neutro Abs: 1.9 10*3/uL (ref 1.7–7.7)
Neutrophils Relative %: 40 %
Platelets: 215 10*3/uL (ref 150–400)
RBC: 4.52 MIL/uL (ref 4.22–5.81)
RDW: 12.3 % (ref 11.5–15.5)
WBC: 4.8 10*3/uL (ref 4.0–10.5)
nRBC: 0 % (ref 0.0–0.2)

## 2022-12-01 LAB — I-STAT CHEM 8, ED
BUN: 12 mg/dL (ref 6–20)
Calcium, Ion: 1.12 mmol/L — ABNORMAL LOW (ref 1.15–1.40)
Chloride: 101 mmol/L (ref 98–111)
Creatinine, Ser: 0.9 mg/dL (ref 0.61–1.24)
Glucose, Bld: 89 mg/dL (ref 70–99)
HCT: 44 % (ref 39.0–52.0)
Hemoglobin: 15 g/dL (ref 13.0–17.0)
Potassium: 3.9 mmol/L (ref 3.5–5.1)
Sodium: 139 mmol/L (ref 135–145)
TCO2: 26 mmol/L (ref 22–32)

## 2022-12-01 MED ORDER — OXYCODONE-ACETAMINOPHEN 5-325 MG PO TABS: 1.0000 | ORAL_TABLET | Freq: Three times a day (TID) | ORAL | 0 refills | Status: DC | PRN

## 2022-12-01 MED ORDER — IOHEXOL 350 MG/ML SOLN
75.0000 mL | Freq: Once | INTRAVENOUS | Status: AC | PRN
  Administered 2022-12-01: 75 mL via INTRAVENOUS

## 2022-12-01 MED ORDER — ACETAMINOPHEN 500 MG PO TABS: 500.0000 mg | ORAL_TABLET | Freq: Four times a day (QID) | ORAL | 0 refills | Status: AC | PRN

## 2022-12-01 MED ORDER — NAPROXEN 500 MG PO TABS: 500.0000 mg | ORAL_TABLET | Freq: Two times a day (BID) | ORAL | 0 refills | Status: DC

## 2022-12-01 MED ORDER — ACETAMINOPHEN 325 MG PO TABS
650.0000 mg | ORAL_TABLET | Freq: Once | ORAL | Status: AC
  Administered 2022-12-01: 650 mg via ORAL
  Filled 2022-12-01: qty 2

## 2022-12-01 MED ORDER — NAPROXEN 250 MG PO TABS
500.0000 mg | ORAL_TABLET | Freq: Once | ORAL | Status: AC
  Administered 2022-12-01: 500 mg via ORAL
  Filled 2022-12-01: qty 2

## 2022-12-01 MED ORDER — LIDOCAINE 5 % EX PTCH
1.0000 | MEDICATED_PATCH | CUTANEOUS | Status: DC
  Administered 2022-12-01: 1 via TRANSDERMAL
  Filled 2022-12-01: qty 1

## 2022-12-01 MED ORDER — LIDOCAINE 5 % EX PTCH: 1.0000 | MEDICATED_PATCH | CUTANEOUS | 0 refills | Status: DC

## 2022-12-01 NOTE — ED Triage Notes (Signed)
Patient c/o left lateral chest pain onset Thurs. States he went to [facility] on fri was given medication without relief, denies reason injury, states he did have a fall 1 year  ago, denies cough or cold sx. Pain is worse with movement.

## 2022-12-01 NOTE — ED Provider Notes (Signed)
[place] Provider Note   CSN: [id number] Arrival date & time: 12/01/22  2706     History  Chief Complaint  Patient presents with   Chest Pain    Nathan Simon is a 42 y.o. male.  HPI    42 year old male comes in with chief complaint of left-sided chest pain.  Patient states that his chest pain started about 4 days ago.  The pain is focal over the left anterolateral region, it is worse with palpation, with deep inspiration and any movement that engages the chest wall muscles.  Patient describes the pain as sharp and excruciating, limiting his ability to function properly.  He denies any specific blunt trauma.  Patient denies shortness of breath.  About a year ago he had a fall that led to injury on that side.  No history of PE, DVT.  Pain is not better or worse when he is laying flat versus sitting up.  No rash.  He was given muscle relaxant and Norco at the urgent care, and it gives him transient relief at best.  Home Medications Prior to Admission medications   Medication Sig Start Date End Date Taking? Authorizing Provider  acetaminophen (TYLENOL) 500 MG tablet Take 1 tablet (500 mg total) by mouth every 6 (six) hours as needed. 12/01/22  Yes Varney Biles, MD  ciprofloxacin (CIPRO) 250 MG tablet Take 250 mg by mouth 2 (two) times daily. 7 day course.   Yes [provider]  diclofenac (VOLTAREN) 75 MG EC tablet Take 75 mg by mouth 2 (two) times daily as needed (pain). 10/03/22  Yes [provider]  lidocaine (LIDODERM) 5 % Place 1 patch onto the skin daily. Remove & Discard patch within 12 hours or as directed by MD 12/01/22  Yes Varney Biles, MD  naproxen (NAPROSYN) 500 MG tablet Take 1 tablet (500 mg total) by mouth 2 (two) times daily. 12/01/22  Yes Varney Biles, MD  oxyCODONE-acetaminophen (PERCOCET/ROXICET) 5-325 MG tablet Take 1 tablet by mouth every 8 (eight) hours as needed for severe pain. 12/01/22  Yes  Varney Biles, MD  sildenafil (VIAGRA) 100 MG tablet Take 100 mg by mouth daily as needed for erectile dysfunction.   Yes [provider]  tiZANidine (ZANAFLEX) 2 MG tablet Take 1 tablet (2 mg total) by mouth every 8 (eight) hours as needed for muscle spasms. 09/13/21  Yes Hazel Sams, PA-C  traZODone (DESYREL) 50 MG tablet Take 50 mg by mouth at bedtime as needed for sleep.   Yes [provider]  predniSONE (DELTASONE) 20 MG tablet Take 2 tablets (40 mg total) by mouth daily. Patient not taking: Reported on 12/01/2022 11/26/22   Vanessa Kick, MD      Allergies    Patient has no known allergies.    Review of Systems   Review of Systems  All other systems reviewed and are negative.   Physical Exam Updated Vital Signs BP 116/86 (BP Location: Right Arm)   Pulse 68   Temp 98.1 F (36.7 C) (Oral)   Resp 19   Ht 6' (1.829 m)   Wt 90.7 kg   SpO2 99%   BMI 27.12 kg/m  Physical Exam Vitals and nursing note reviewed.  Constitutional:      Appearance: He is well-developed.  HENT:     Head: Atraumatic.  Cardiovascular:     Rate and Rhythm: Normal rate.  Pulmonary:     Effort: Pulmonary effort is normal.  Musculoskeletal:     Cervical back: Neck supple.     Comments: Reproducible chest wall tenderness over the left side  Skin:    General: Skin is warm.  Neurological:     Mental Status: He is alert and oriented to person, place, and time.     ED Results / Procedures / Treatments   Labs (all labs ordered are listed, but only abnormal results are displayed) Labs Reviewed  I-STAT CHEM 8, ED - Abnormal; Notable for the following components:      Result Value   Calcium, Ion 1.12 (*)    All other components within normal limits  CBC WITH DIFFERENTIAL/PLATELET    EKG EKG Interpretation  Date/Time:  Monday December 01 2022 08:42:42 EST Ventricular Rate:  75 PR Interval:  160 QRS Duration: 88 QT Interval:  374 QTC Calculation: 418 R Axis:   69 Text  Interpretation: Sinus rhythm No acute changes No significant change since last tracing Confirmed by Varney Biles [phone]) on 12/01/2022 9:16:26 AM  Radiology CT Angio Chest PE W and/or Wo Contrast  Result Date: 12/01/2022 CLINICAL DATA:  Left lateral chest pain concern for pulmonary embolus. EXAM: CT ANGIOGRAPHY CHEST WITH CONTRAST TECHNIQUE: Multidetector CT imaging of the chest was performed using the standard protocol during bolus administration of intravenous contrast. Multiplanar CT image reconstructions and MIPs were obtained to evaluate the vascular anatomy. RADIATION DOSE REDUCTION: This exam was performed according to the departmental dose-optimization program which includes automated exposure control, adjustment of the mA and/or kV according to patient size and/or use of iterative reconstruction technique. CONTRAST:  21mL OMNIPAQUE IOHEXOL 350 MG/ML SOLN COMPARISON:  None Available. FINDINGS: Cardiovascular: Satisfactory opacification of the pulmonary arteries to the segmental level. No evidence of pulmonary embolism. Normal heart size. No pericardial effusion. Mediastinum/Nodes: No enlarged mediastinal, hilar, or axillary lymph nodes. Thyroid gland, trachea, and esophagus demonstrate no significant findings. Lungs/Pleura: Curvilinear opacities in the bilateral lower lobes with interposed ground-glass opacities. No pleural effusion. No pneumothorax. Upper Abdomen: No acute abnormality Musculoskeletal: No acute osseous abnormality Review of the MIP images confirms the above findings. IMPRESSION: 1. No evidence of acute pulmonary embolism. 2. Curvilinear opacities in the bilateral lower lobes with interposed ground-glass opacities, favor atelectasis although superimposed infection can not be excluded. Electronically Signed   By: Dahlia Bailiff M.D.   On: 12/01/2022 14:19   DG Ribs Unilateral W/Chest Left  Result Date: 12/01/2022 CLINICAL DATA:  Left chest wall pain no known injury EXAM: LEFT RIBS AND  CHEST - 3+ VIEW COMPARISON:  None Available. FINDINGS: No fracture or other bone lesions are seen involving the ribs. There is no evidence of pneumothorax or pleural effusion. Left basilar atelectasis or infiltrate. Heart size and mediastinal contours are within normal limits. IMPRESSION: 1. Left basilar atelectasis or infiltrate. 2. No evidence of displaced rib fracture. Electronically Signed   By: Keane Police D.O.   On: 12/01/2022 10:13    Procedures Procedures    Medications Ordered in ED Medications  lidocaine (LIDODERM) 5 % 1 patch (1 patch Transdermal Patch Applied 12/01/22 1143)  naproxen (NAPROSYN) tablet 500 mg (500 mg Oral Given 12/01/22 1143)  acetaminophen (TYLENOL) tablet 650 mg (650 mg Oral Given 12/01/22 1143)  iohexol (OMNIPAQUE) 350 MG/ML injection 75 mL (75 mLs Intravenous Contrast Given 12/01/22 1409)    ED Course/ Medical Decision Making/ A&P Clinical Course as of 12/01/22 1511  Mon Dec 01, 2022  1409 DG Ribs Unilateral W/Chest Left X-ray independently interpreted.  There is clear  evidence of left-sided opacity in the lower lung fields.  Patient's pain is quite intense.  I think the left-sided opacities likely aspiration because of his pain, emerging pneumonia is also possible.  However we will get CT angio to rule out any evidence of pulmonary infarct.  The pain seems out of proportion, before we call this musculoskeletal pain we will want to make sure there is no other etiology. [AN]    Clinical Course User Index [AN] Varney Biles, MD                             Medical Decision Making 42 year old patient comes in with chief complaint of left-sided abdominal pain. He has no concerning medical history and smokes occasionally, about 1 to 2 packs a week.  He has no history of substance use disorder.  Differential diagnosis for his focal pain includes pleurisy, pneumothorax, rib fracture, rib contusion, soft tissue contusion, zoster without rash.  Exam is overall  reassuring.  EKG was ordered and reviewed independently, no evidence of pericarditis.  Will get x-ray of the ribs and chest to rule out any concerning pathology, lytic lesions, pneumothorax etc.  Also we will ensure there is no evolving pneumonia.  Lung exam is overall reassuring.  Amount and/or Complexity of Data Reviewed Labs: ordered. Radiology: ordered. Decision-making details documented in ED Course.  Risk OTC drugs. Prescription drug management.   Incentive parameter ordered.  The patient appears reasonably screened and/or stabilized for discharge and I doubt any other medical condition or other [facility] requiring further screening, evaluation, or treatment in the ED at this time prior to discharge.   Results from the ER workup discussed with the patient face to face and all questions answered to the best of my ability. The patient is safe for discharge with strict return precautions.   Final Clinical Impression(s) / ED Diagnoses Final diagnoses:  Atelectasis  Chest wall pain    Rx / DC Orders ED Discharge Orders          Ordered    oxyCODONE-acetaminophen (PERCOCET/ROXICET) 5-325 MG tablet  Every 8 hours PRN        12/01/22 1509    naproxen (NAPROSYN) 500 MG tablet  2 times daily        12/01/22 1509    lidocaine (LIDODERM) 5 %  Every 24 hours        12/01/22 1509    acetaminophen (TYLENOL) 500 MG tablet  Every 6 hours PRN        12/01/22 1509              Varney Biles, MD 12/01/22 1511

## 2022-12-01 NOTE — ED Notes (Signed)
Graham crackers, PB and ginger ale provided per EDP approval

## 2022-12-01 NOTE — ED Notes (Signed)
Pt returned form CT.

## 2022-12-01 NOTE — Discharge Instructions (Addendum)
You are seen in the ER for your pain.  CT scan is not showing any evidence of rib fractures, infection, blood clots or cancer.  The pain is likely musculoskeletal.  Take the medicines that are prescribed.  Ice and heat the area of pain.  See your primary care doctor in 1 week.

## 2022-12-01 NOTE — ED Notes (Addendum)
Dc instructions reviewed with pt. Incentive Spirometry with instructions provided. PT verbalized understanding. PT DC.

## 2022-12-15 DIAGNOSIS — M545 Low back pain, unspecified: Secondary | ICD-10-CM | POA: Diagnosis not present

## 2023-08-20 IMAGING — DX DG RIBS W/ CHEST 3+V*L*
5 series · 5 of 5 positions shown · non-contrast
Comparison: None, correlation is made with thoracic spine
radiographs 04/18/2020.

CLINICAL DATA: Fall onto tree stump, now with left lateral rib pain

EXAM:
LEFT RIBS AND CHEST - 3+ VIEW

[chest pa]
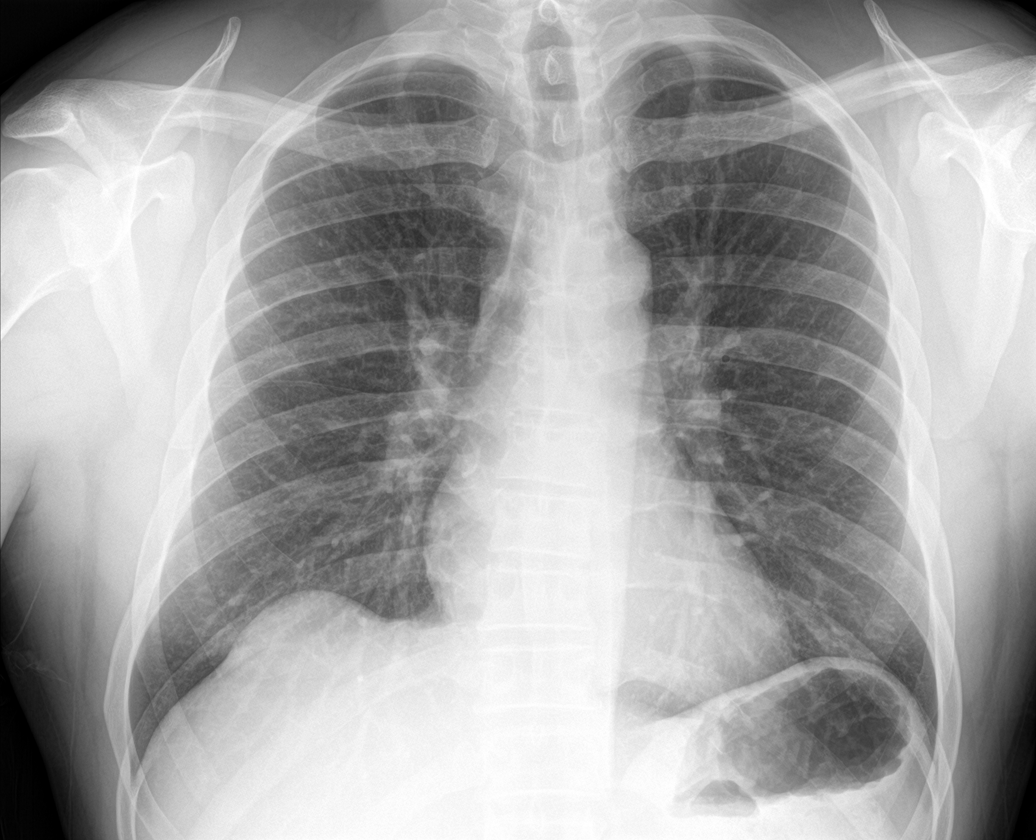

[rib obl (1 of 2)]
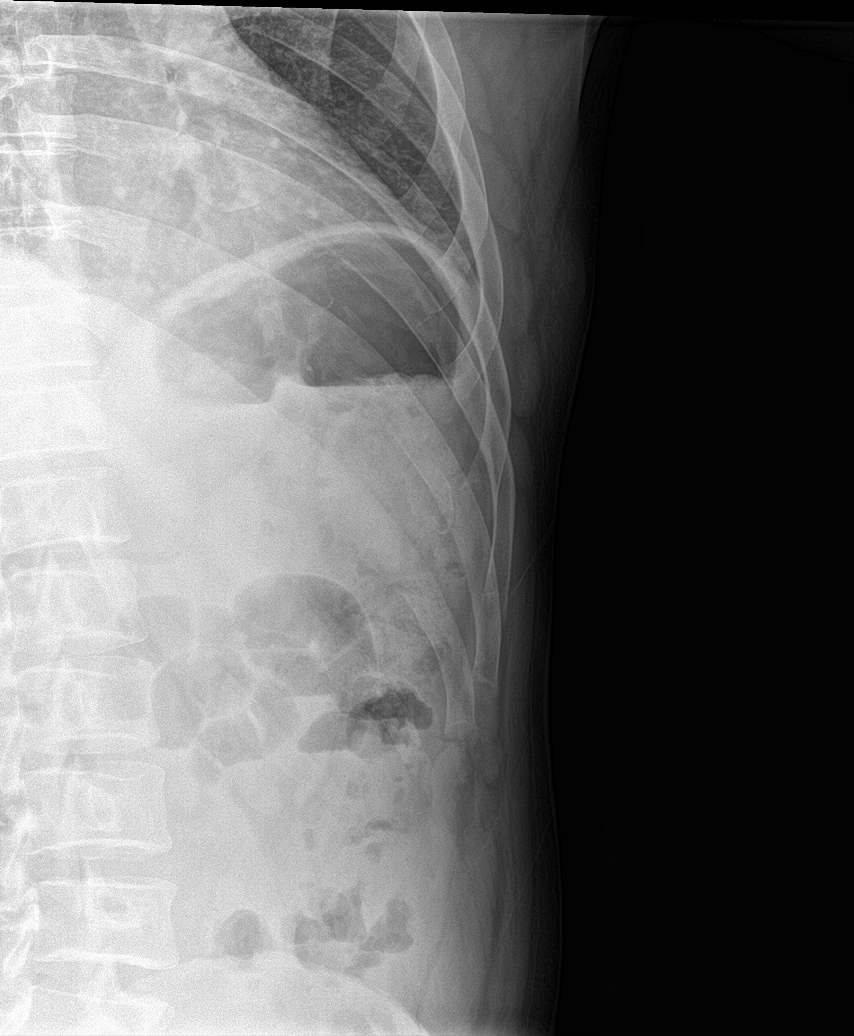

[rib obl (2 of 2)]
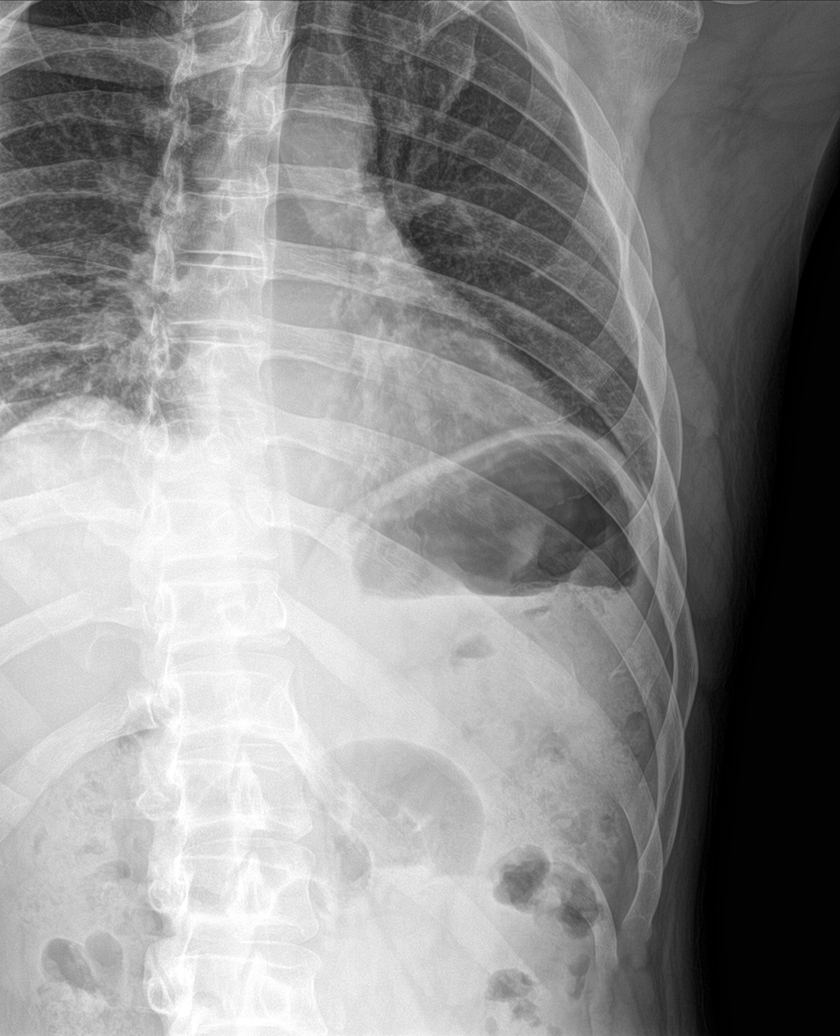

[rib pa (1 of 2)]
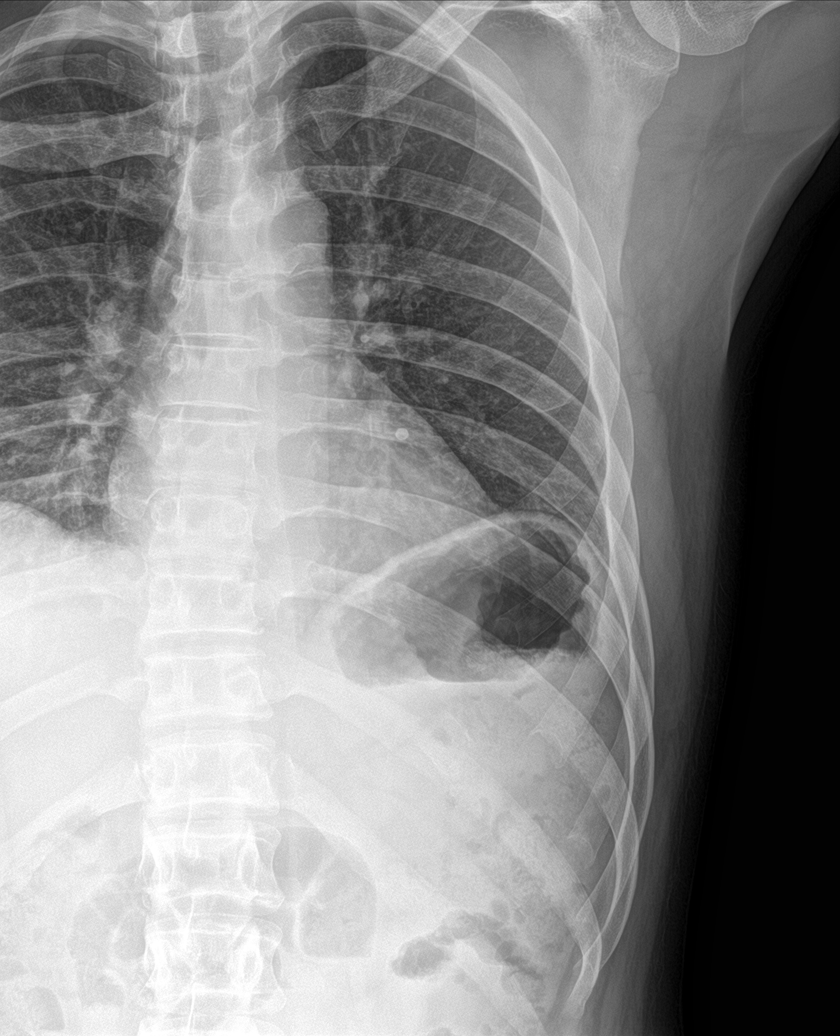

[rib pa (2 of 2)]
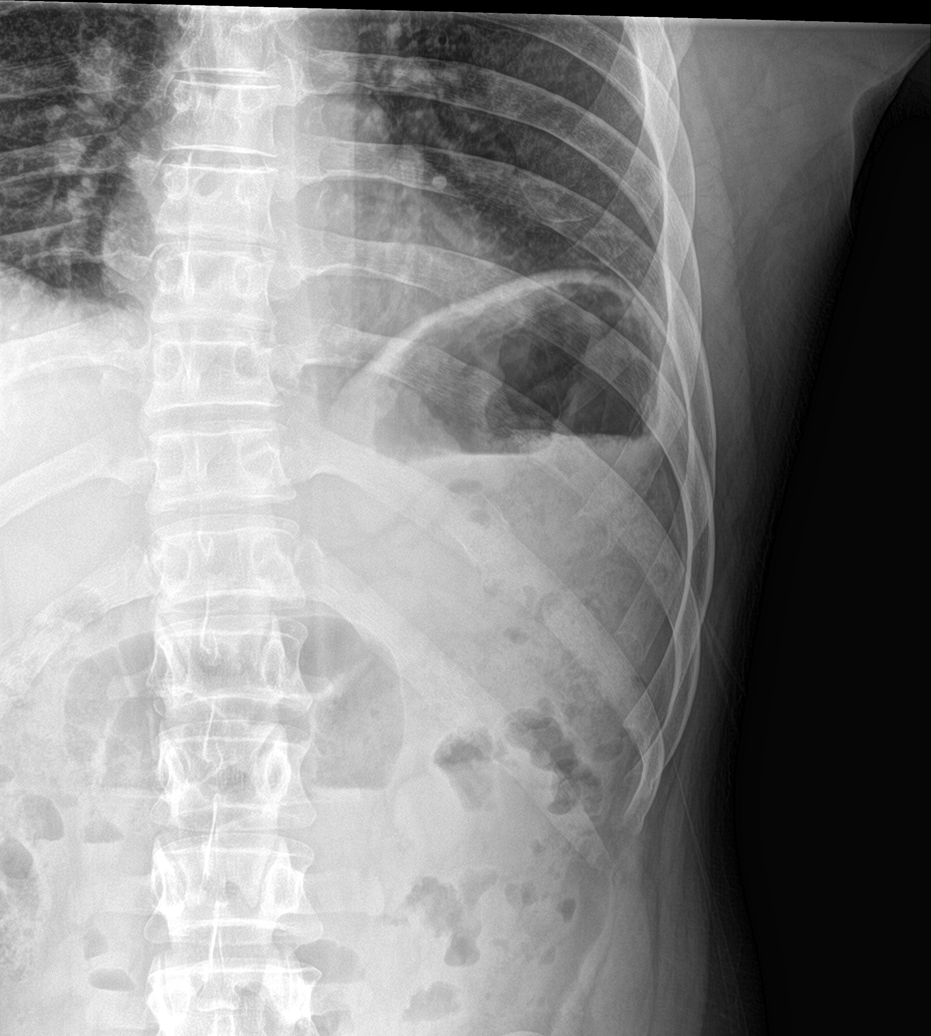

[5 of 5 positions shown; findings below may reference images not displayed]

FINDINGS: No fracture or other bone lesions are seen involving the ribs. There
is no evidence of pneumothorax or pleural effusion. Both lungs are
clear. Heart size and mediastinal contours are within normal limits.
IMPRESSION: Negative.

## 2024-03-26 ENCOUNTER — Encounter (HOSPITAL_COMMUNITY): Payer: Self-pay

## 2024-03-26 ENCOUNTER — Other Ambulatory Visit: Payer: Self-pay

## 2024-03-26 ENCOUNTER — Emergency Department (HOSPITAL_COMMUNITY)
Admission: EM | Admit: 2024-03-26 | Discharge: 2024-03-26 | Disposition: A | Discharge status: Home / Self Care | Attending: Emergency Medicine | Admitting: Emergency Medicine

## 2024-03-26 DIAGNOSIS — M546 Pain in thoracic spine: Secondary | ICD-10-CM | POA: Insufficient documentation

## 2024-03-26 MED ORDER — OXYCODONE HCL 5 MG PO TABS: 5.0000 mg | ORAL_TABLET | Freq: Four times a day (QID) | ORAL | 0 refills | Status: DC | PRN

## 2024-03-26 MED ORDER — IBUPROFEN 800 MG PO TABS
800.0000 mg | ORAL_TABLET | Freq: Once | ORAL | Status: AC
  Administered 2024-03-26: 800 mg via ORAL
  Filled 2024-03-26: qty 1

## 2024-03-26 MED ORDER — IBUPROFEN 800 MG PO TABS: 800.0000 mg | ORAL_TABLET | Freq: Three times a day (TID) | ORAL | 0 refills | Status: AC

## 2024-03-26 MED ORDER — OXYCODONE HCL 5 MG PO TABS
5.0000 mg | ORAL_TABLET | Freq: Once | ORAL | Status: AC
  Administered 2024-03-26: 5 mg via ORAL
  Filled 2024-03-26: qty 1

## 2024-03-26 MED ORDER — METHYLPREDNISOLONE 4 MG PO TBPK: ORAL_TABLET | ORAL | 0 refills | Status: DC

## 2024-03-26 NOTE — Discharge Instructions (Addendum)
 You were given narcotic opioid medicine in the ER for pain.  Do not drive a car today.

## 2024-03-26 NOTE — ED Triage Notes (Signed)
 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

## 2024-03-26 NOTE — ED Provider Notes (Signed)
 [place] EMERGENCY DEPARTMENT AT [facility] Provider Note   CSN: [id number] Arrival date & time: 03/26/24  4098     History  Chief Complaint  Patient presents with   Back Pain    Nathan Simon is a 43 y.o. male presented to ED with midthoracic back pain.  Patient reports that he was doing heavy lifting 3 days ago lifting up a 4 wheeler, did not have any pain at the time, but later evening and having pain in the middle of his back to the shoulder blades.  He has had back pain issues for a long time.  He works at a job in [employer] or heavy lifting where he is repeatedly having jerking and stress placed on his back.  He does not see an orthopedic specialist.  He reports his current back pain is high intensity, did not get relieved with diclofenac at home.  It is worse with movement and deep inspiration.  HPI     Home Medications Prior to Admission medications   Medication Sig Start Date End Date Taking? Authorizing Provider  ibuprofen  (ADVIL ) 800 MG tablet Take 1 tablet (800 mg total) by mouth 3 (three) times daily for 30 doses. 03/26/24 04/05/24 Yes Annora Guderian, Janalyn Me, MD  methylPREDNISolone (MEDROL DOSEPAK) 4 MG TBPK tablet Use as directed 03/26/24  Yes Svea Pusch, Janalyn Me, MD  oxyCODONE  (ROXICODONE ) 5 MG immediate release tablet Take 1 tablet (5 mg total) by mouth every 6 (six) hours as needed for up to 15 doses for severe pain (pain score 7-10). 03/26/24  Yes Shneur Whittenburg, Janalyn Me, MD  acetaminophen  (TYLENOL ) 500 MG tablet Take 1 tablet (500 mg total) by mouth every 6 (six) hours as needed. 12/01/22   Nanavati, Ankit, MD  ciprofloxacin (CIPRO) 250 MG tablet Take 250 mg by mouth 2 (two) times daily. 7 day course.    [provider]  diclofenac (VOLTAREN) 75 MG EC tablet Take 75 mg by mouth 2 (two) times daily as needed (pain). 10/03/22   [provider]  lidocaine  (LIDODERM ) 5 % Place 1 patch onto the skin daily. Remove & Discard patch within 12 hours or as  directed by MD 12/01/22   Deatra Face, MD  naproxen  (NAPROSYN ) 500 MG tablet Take 1 tablet (500 mg total) by mouth 2 (two) times daily. 12/01/22   Nanavati, Ankit, MD  oxyCODONE -acetaminophen  (PERCOCET/ROXICET) 5-325 MG tablet Take 1 tablet by mouth every 8 (eight) hours as needed for severe pain. 12/01/22   Deatra Face, MD  predniSONE  (DELTASONE ) 20 MG tablet Take 2 tablets (40 mg total) by mouth daily. Patient not taking: Reported on 12/01/2022 11/26/22   Afton Albright, MD  sildenafil (VIAGRA) 100 MG tablet Take 100 mg by mouth daily as needed for erectile dysfunction.    [provider]  tiZANidine  (ZANAFLEX ) 2 MG tablet Take 1 tablet (2 mg total) by mouth every 8 (eight) hours as needed for muscle spasms. 09/13/21   Graham, Laura E, PA-C  traZODone  (DESYREL ) 50 MG tablet Take 50 mg by mouth at bedtime as needed for sleep.    [provider]      Allergies    Tylenol  [acetaminophen ]    Review of Systems   Review of Systems  Physical Exam Updated Vital Signs BP (!) 130/99 (BP Location: Right Arm)   Pulse 85   Temp 97.9 F (36.6 C)   Resp 18   Ht 6' (1.829 m)   Wt 99.8 kg   SpO2 100%  BMI 29.84 kg/m  Physical Exam Constitutional:      General: He is not in acute distress. HENT:     Head: Normocephalic and atraumatic.  Eyes:     Conjunctiva/sclera: Conjunctivae normal.     Pupils: Pupils are equal, round, and reactive to light.  Cardiovascular:     Rate and Rhythm: Normal rate and regular rhythm.  Pulmonary:     Effort: Pulmonary effort is normal. No respiratory distress.  Abdominal:     General: There is no distension.     Tenderness: There is no abdominal tenderness.  Musculoskeletal:     Comments: Reproducible midline thoracic and paraspinal tenderness on exam, worse with movement, axial loading and rotation of the spine  Skin:    General: Skin is warm and dry.  Neurological:     General: No focal deficit present.     Mental Status: He is alert.  Mental status is at baseline.  Psychiatric:        Mood and Affect: Mood normal.        Behavior: Behavior normal.     ED Results / Procedures / Treatments   Labs (all labs ordered are listed, but only abnormal results are displayed) Labs Reviewed - No data to display  EKG None  Radiology No results found.  Procedures Procedures    Medications Ordered in ED Medications  oxyCODONE  (Oxy IR/ROXICODONE ) immediate release tablet 5 mg (has no administration in time range)  ibuprofen  (ADVIL ) tablet 800 mg (has no administration in time range)    ED Course/ Medical Decision Making/ A&P                                 Medical Decision Making Risk Prescription drug management.   Patient is presenting with mid back pain with a strong musculoskeletal component.  I suspect this is likely nerve related or muscle related.  Have a low suspicion for intrathoracic emergency such as aortic dissection, pneumothorax, or ACS.  Doubt pulmonary embolism.  We will try some stronger pain killers as well as a Medrol Dosepak and high-dose NSAIDs.  I did give him an orthopedic office number to follow-up with given the chronicity of recurrence of back pain and problems he has been having.  Also a work note for rest.  The patient is comfortable with this plan.  He is well-appearing and stable for discharge, and his wife will be driving him.        Final Clinical Impression(s) / ED Diagnoses Final diagnoses:  Acute midline thoracic back pain    Rx / DC Orders ED Discharge Orders          Ordered    oxyCODONE  (ROXICODONE ) 5 MG immediate release tablet  Every 6 hours PRN        03/26/24 1117    ibuprofen  (ADVIL ) 800 MG tablet  3 times daily        03/26/24 1117    methylPREDNISolone (MEDROL DOSEPAK) 4 MG TBPK tablet        03/26/24 1117              Arvilla Birmingham, MD 03/26/24 1120

## 2024-10-24 ENCOUNTER — Ambulatory Visit (HOSPITAL_COMMUNITY): Admission: EM | Admit: 2024-10-24 | Discharge: 2024-10-24 | Disposition: A | Discharge status: Home / Self Care
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
## 2024-10-31 ENCOUNTER — Ambulatory Visit (HOSPITAL_COMMUNITY): Admission: EM | Admit: 2024-10-31 | Discharge: 2024-10-31 | Disposition: A | Discharge status: Home / Self Care

## 2024-10-31 DIAGNOSIS — F319 Bipolar disorder, unspecified: Secondary | ICD-10-CM | POA: Diagnosis not present

## 2024-10-31 DIAGNOSIS — F1010 Alcohol abuse, uncomplicated: Secondary | ICD-10-CM | POA: Diagnosis not present

## 2024-10-31 NOTE — ED Provider Notes (Addendum)
 Behavioral Health Urgent Care Medical Screening Exam  Patient Name: Nathan Simon MRN: [id number] Date of Evaluation: 10/31/2024 Chief Complaint:  Medication Refill Diagnosis:  Final diagnoses:  Bipolar depression (HCC)  Alcohol consumption binge drinking    History of Present illness: Nathan Simon is a 44 y.o. male w/ hx of bipolar depression presents [facility] for medication refill. He reports he has been out of psychotropics for ~1-2 months. He presents with his wife. He reports struggling with his mental health for past few months reporting depressive episodes of depressed mood, poor sleep, appetite changes, guilt, hopelessness, and passive SI. He denies current SI/HI/AVH. He has never had intent nor plan. He reports history of hypomania where he will go 4-5 days with pressured speech, euphoria, distractibility, flight of ideas, impulsive spending (gamble all his money), and decreased need for sleep. He endorses having regular episode of this outside of any substance use.   He denies any substance use with the exception of alcohol. He states he will binge drink a fifth of liquor when he's stressed every few weeks. He states he has difficulty stopping after he starts drinking.   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 He lives with his wife and 4 yo daughter. No firearms in the house.   Past psych medications: lexapro , trazodone    Flowsheet Row ED from 10/31/2024 in [place] ED from 10/24/2024 in [facility] ED from 03/26/2024 in [facility] Emergency Department at [facility]  C-SSRS RISK CATEGORY No Risk No Risk No Risk    Psychiatric Specialty Exam  Presentation  General Appearance:Appropriate for Environment; Casual  Eye Contact:Fair  Speech:Clear and Coherent; Normal Rate  Speech Volume:Normal  Handedness:Right   Mood and Affect   Mood:Depressed  Affect:Appropriate; Congruent   Thought Process  Thought Processes:Coherent; Goal Directed; Linear  Descriptions of Associations:Intact  Orientation:Full (Time, Place and Person)  Thought Content:Logical  Diagnosis of Schizophrenia or Schizoaffective disorder in past: No data recorded  Hallucinations:None  Ideas of Reference:None  Suicidal Thoughts:No  Homicidal Thoughts:No   Sensorium  Memory:Remote Fair  Judgment:Fair  Insight:No data recorded  Executive Functions  Concentration:Fair  Attention Span:Fair  Recall:Fair  Fund of Knowledge:Fair  Language:Fair   Psychomotor Activity  Psychomotor Activity:Normal   Assets  Assets:Communication Skills; Desire for Improvement; Resilience; Social Support   Sleep  Sleep:Fair  Number of hours: No data recorded  Physical Exam: Physical Exam Constitutional:      Appearance: Normal appearance.  HENT:     Head: Normocephalic and atraumatic.  Eyes:     Extraocular Movements: Extraocular movements intact.     Pupils: Pupils are equal, round, and reactive to light.  Cardiovascular:     Rate and Rhythm: Normal rate.     Pulses: Normal pulses.  Pulmonary:     Effort: Pulmonary effort is normal.     Breath sounds: Normal breath sounds.  Musculoskeletal:        General: Normal range of motion.     Cervical back: Normal range of motion.  Skin:    General: Skin is warm and dry.  Neurological:     General: No focal deficit present.     Mental Status: He is alert and oriented to person, place, and time.    Review of Systems  Respiratory:  Negative for shortness of breath.   Cardiovascular:  Negative for chest pain.  Gastrointestinal:  Negative for  abdominal pain, constipation, diarrhea, heartburn, nausea and vomiting.  Neurological:  Negative for headaches.   Blood pressure (!) 147/86, pulse 70, temperature 98.7 F (37.1 C), temperature source Oral, resp. rate 18, SpO2 98%. There is no  height or weight on file to calculate BMI.  Musculoskeletal: Strength & Muscle Tone: within normal limits Gait & Station: normal Patient leans: N/A   [facility] MSE Discharge Disposition for Follow up and Recommendations: Based on my evaluation the patient does not appear to have an emergency medical condition and can be discharged with resources and follow up care in outpatient services for Medication Management and Individual Therapy  -Provided OP resources for psychiatry and advised him to schedule appointment within the week -Referral to [facility] psychiatry placed -recommended holding duloxetine as to reduce risk of worsening mania -May benefit from mood stabilizer or antipsychotic to address suspected type 2 bipolar disorder    Prentice Espy, MD 10/31/2024, 4:04 PM

## 2024-10-31 NOTE — Discharge Instructions (Addendum)
 Please contact one of the following facilities to start medication management and therapy services:   [facility] at [facility] [address] [place] [address]  [place], [place] [address] [phone]   [facility]  [address] [place], [place] [address] [phone]  [facility] [place]  [address] [place], [place] [address] [phone]  [facility]  [address]  [place], [place] [address] [phone]  [facility]  [address] [place], [place] [address] [phone]  [facility]  [address]  [place], [place] [address] [phone]  [facility] Address: [address] [place], [place], [place] [address] Phone: [phone] Services: Specialize in outpatient and substance abuse therapy for couples, family and children  [facility] Address: [address], [place], [place] [address] Phone: [phone]  [facility] Address: [address], [place], [place] [address] Phone: [phone]

## 2024-10-31 NOTE — Progress Notes (Signed)
" °   10/31/24 1432  [facility] Triage Screening (Walk-ins at [facility] only)  How Did You Hear About Us ? Self  What Is the Reason for Your Visit/Call Today? Nathan Simon is a 31Y male presenting to [facility] as a vol walk-in. Pt states he is here today because he has run out of his bipolar medication. Pt states it has been a month since he has had his medicine. Pt is also seeking paperwork for his job. Pt denies SI, HI, AVH, and substance use.  How Long Has This Been Causing You Problems? <Week  Have You Recently Had Any Thoughts About Hurting Yourself? No  Are You Planning to Commit Suicide/Harm Yourself At This time? No  Have you Recently Had Thoughts About Hurting Someone Sherral? No  Are You Planning To Harm Someone At This Time? No  Physical Abuse Denies  Verbal Abuse Denies  Sexual Abuse Denies  Exploitation of patient/patient's resources Denies  Are you currently experiencing any auditory, visual or other hallucinations? No  Have You Used Any Alcohol or Drugs in the Past 24 Hours? No  Do you have any current medical co-morbidities that require immediate attention? No  What Do You Feel Would Help You the Most Today? Medication(s)  Determination of Need Routine (7 days)  Options For Referral Medication Management    "

## 2024-12-28 ENCOUNTER — Ambulatory Visit (HOSPITAL_COMMUNITY)
